# Patient Record
Sex: Male | Born: 1935 | Race: White | Hispanic: No | Marital: Married | Smoking: Former smoker
Health system: Southern US, Community
[De-identification: ages and names within clinical notes are randomized; demographics above are authoritative.]

## PROBLEM LIST (undated history)

## (undated) DIAGNOSIS — M159 Polyosteoarthritis, unspecified: Secondary | ICD-10-CM

## (undated) DIAGNOSIS — N200 Calculus of kidney: Secondary | ICD-10-CM

## (undated) DIAGNOSIS — E785 Hyperlipidemia, unspecified: Secondary | ICD-10-CM

## (undated) DIAGNOSIS — N183 Chronic kidney disease, stage 3 (moderate): Secondary | ICD-10-CM

## (undated) DIAGNOSIS — H25019 Cortical age-related cataract, unspecified eye: Secondary | ICD-10-CM

## (undated) DIAGNOSIS — I10 Essential (primary) hypertension: Secondary | ICD-10-CM

## (undated) DIAGNOSIS — N400 Enlarged prostate without lower urinary tract symptoms: Secondary | ICD-10-CM

## (undated) DIAGNOSIS — H409 Unspecified glaucoma: Secondary | ICD-10-CM

## (undated) DIAGNOSIS — E0590 Thyrotoxicosis, unspecified without thyrotoxic crisis or storm: Secondary | ICD-10-CM

## (undated) DIAGNOSIS — M1990 Unspecified osteoarthritis, unspecified site: Secondary | ICD-10-CM

## (undated) DIAGNOSIS — J45909 Unspecified asthma, uncomplicated: Secondary | ICD-10-CM

## (undated) DIAGNOSIS — M359 Systemic involvement of connective tissue, unspecified: Secondary | ICD-10-CM

## (undated) HISTORY — DX: Thyrotoxicosis, unspecified without thyrotoxic crisis or storm: E05.90

## (undated) HISTORY — DX: Calculus of kidney: N20.0

## (undated) HISTORY — DX: Unspecified osteoarthritis, unspecified site: M19.90

## (undated) HISTORY — PX: JOINT REPLACEMENT: SHX530

## (undated) HISTORY — DX: Essential (primary) hypertension: I10

## (undated) HISTORY — DX: Cortical age-related cataract, unspecified eye: H25.019

## (undated) HISTORY — DX: Unspecified glaucoma: H40.9

## (undated) HISTORY — DX: Polyosteoarthritis, unspecified: M15.9

## (undated) HISTORY — PX: CATARACT EXTRACTION: SUR2

## (undated) HISTORY — PX: OTHER SURGICAL HISTORY: SHX169

## (undated) HISTORY — DX: Hyperlipidemia, unspecified: E78.5

## (undated) HISTORY — PX: TOTAL HIP ARTHROPLASTY: SHX124

## (undated) HISTORY — DX: Benign prostatic hyperplasia without lower urinary tract symptoms: N40.0

## (undated) HISTORY — DX: Chronic kidney disease, stage 3 (moderate): N18.3

## (undated) HISTORY — PX: LITHOTRIPSY: SUR834

---

## 2004-12-27 ENCOUNTER — Ambulatory Visit: Payer: Self-pay | Admitting: Ophthalmology

## 2004-12-31 ENCOUNTER — Ambulatory Visit: Payer: Self-pay | Admitting: Ophthalmology

## 2005-08-22 ENCOUNTER — Ambulatory Visit: Payer: Self-pay | Admitting: Internal Medicine

## 2005-08-26 ENCOUNTER — Ambulatory Visit: Payer: Self-pay | Admitting: *Deleted

## 2005-08-26 ENCOUNTER — Other Ambulatory Visit: Payer: Self-pay

## 2006-02-21 ENCOUNTER — Ambulatory Visit: Payer: Self-pay | Admitting: Unknown Physician Specialty

## 2006-02-21 HISTORY — PX: ESOPHAGOGASTRODUODENOSCOPY: SHX1529

## 2006-02-21 HISTORY — PX: COLONOSCOPY: SHX174

## 2006-03-10 ENCOUNTER — Ambulatory Visit: Payer: Self-pay | Admitting: Internal Medicine

## 2006-03-13 ENCOUNTER — Ambulatory Visit: Payer: Self-pay | Admitting: Internal Medicine

## 2006-03-26 ENCOUNTER — Ambulatory Visit: Payer: Self-pay | Admitting: Internal Medicine

## 2008-05-02 ENCOUNTER — Ambulatory Visit: Payer: Self-pay

## 2009-03-31 ENCOUNTER — Ambulatory Visit: Payer: Self-pay | Admitting: Urology

## 2014-05-03 DIAGNOSIS — N200 Calculus of kidney: Secondary | ICD-10-CM

## 2014-05-03 DIAGNOSIS — M159 Polyosteoarthritis, unspecified: Secondary | ICD-10-CM

## 2014-05-03 DIAGNOSIS — N400 Enlarged prostate without lower urinary tract symptoms: Secondary | ICD-10-CM

## 2014-05-03 DIAGNOSIS — E785 Hyperlipidemia, unspecified: Secondary | ICD-10-CM

## 2014-05-03 DIAGNOSIS — I10 Essential (primary) hypertension: Secondary | ICD-10-CM

## 2014-05-03 DIAGNOSIS — N183 Chronic kidney disease, stage 3 unspecified: Secondary | ICD-10-CM

## 2014-05-03 HISTORY — DX: Calculus of kidney: N20.0

## 2014-05-03 HISTORY — DX: Hyperlipidemia, unspecified: E78.5

## 2014-05-03 HISTORY — DX: Polyosteoarthritis, unspecified: M15.9

## 2014-05-03 HISTORY — DX: Essential (primary) hypertension: I10

## 2014-05-03 HISTORY — DX: Chronic kidney disease, stage 3 unspecified: N18.30

## 2014-05-03 HISTORY — DX: Benign prostatic hyperplasia without lower urinary tract symptoms: N40.0

## 2016-08-02 ENCOUNTER — Ambulatory Visit: Payer: Self-pay

## 2016-08-16 ENCOUNTER — Encounter: Payer: Self-pay | Admitting: Urology

## 2016-08-16 ENCOUNTER — Ambulatory Visit (INDEPENDENT_AMBULATORY_CARE_PROVIDER_SITE_OTHER): Payer: Medicare Other | Admitting: Urology

## 2016-08-16 VITALS — BP 166/72 | HR 69 | Ht 69.0 in | Wt 157.8 lb

## 2016-08-16 DIAGNOSIS — N200 Calculus of kidney: Secondary | ICD-10-CM | POA: Diagnosis not present

## 2016-08-16 DIAGNOSIS — R972 Elevated prostate specific antigen [PSA]: Secondary | ICD-10-CM | POA: Diagnosis not present

## 2016-08-16 DIAGNOSIS — N400 Enlarged prostate without lower urinary tract symptoms: Secondary | ICD-10-CM | POA: Diagnosis not present

## 2016-08-16 LAB — BLADDER SCAN AMB NON-IMAGING: SCAN RESULT: 32

## 2016-08-16 NOTE — Progress Notes (Signed)
08/16/2016 11:43 AM   Cristopher Estimable Lun 1925-01-28 [id number]  Referring provider: Rusty Aus, MD [facility] [place], [place] [address]  Chief Complaint  Patient presents with  . New Patient (Initial Visit)    elevated PSA    HPI: The patient is an 82 year old gentleman who presents for evaluation for an elevated PSA.    1. Elevated PSA His most recent PSA is 7.19. He is never had a prostate biopsy. He has never seen urologist for this before.  8/17   7.19 6/17   5.60 6/15   3.75  2. BPH The patient has nocturia 2-3 however this is mainly him prone to the pressure when he helps his wife to use the bathroom. He has some hesitancy and weak stream. His PVR is 32. He is not on medications for this. He does not find the symptoms bothersome.  He did have a TURP decades ago.  3. History of nephrolithiasis The patient had lithotripsy decades ago. No signs or symptoms of stones recently.  PMH: Past Medical History:  Diagnosis Date  . Arthritis   . Glaucoma   . Hyperlipidemia   . Hypertension   . Hyperthyroidism     Surgical History: Past Surgical History:  Procedure Laterality Date  . kidney stone    . TOTAL HIP ARTHROPLASTY    . TUBAL LIGATION      Home Medications:    Medication List       Accurate as of 08/16/16 11:43 AM. Always use your most recent med list.          aspirin EC 81 MG tablet Take by mouth.   BENICAR 40 MG tablet Generic drug:  olmesartan TAKE 1 TABLET BY MOUTH ONCE DAILY.   brimonidine 0.1 % Soln Commonly known as:  ALPHAGAN P Apply to eye.   hydroxychloroquine 200 MG tablet Commonly known as:  PLAQUENIL TAKE 1 TABLET BY MOUTH ONCE DAILY.   LUMIGAN 0.01 % Soln Generic drug:  bimatoprost Apply to eye.   metoprolol succinate 50 MG 24 hr tablet Commonly known as:  TOPROL-XL TAKE ONE TABLET BY MOUTH ONCE DAILY   montelukast 10 MG tablet Commonly known as:  SINGULAIR Take by  mouth.   niacin 500 MG tablet Take by mouth.   simvastatin 40 MG tablet Commonly known as:  ZOCOR TAKE 1 TABLET BY MOUTH EVERY NIGHT.   SYMBICORT 80-4.5 MCG/ACT inhaler Generic drug:  budesonide-formoterol INHALE 2 PUFFS BY MOUTH TWICE DAILY AS DIRECTED   SYNTHROID 50 MCG tablet Generic drug:  levothyroxine TAKE 1 TABLET BY MOUTH EVERY MORNING ON AN EMPTY STOMACH. AT LEAST 30-60 MINUTESBEFORE BREAKFAST, WITH A GLASS OF WATER   triamterene-hydrochlorothiazide 75-50 MG tablet Commonly known as:  MAXZIDE TAKE 1 TABLET BY MOUTH ONCE DAILY.   Vitamin D3 2000 units capsule Take by mouth.       Allergies: Allergies not on file  Family History: Family History  Problem Relation Age of Onset  . Prostate cancer Neg Hx     Social History:  reports that he quit smoking about 12 years ago. He has never used smokeless tobacco. He reports that he does not drink alcohol or use drugs.  ROS: UROLOGY Frequent Urination?: Yes Hard to postpone urination?: Yes Burning/pain with urination?: No Get up at night to urinate?: Yes Leakage of urine?: Yes Urine stream starts and stops?: Yes Trouble starting stream?: Yes Do you have to strain to urinate?: Yes Blood in urine?: No  Urinary tract infection?: No Sexually transmitted disease?: No Injury to kidneys or bladder?: No Painful intercourse?: No Weak stream?: Yes Erection problems?: Yes Penile pain?: No  Gastrointestinal Nausea?: No Vomiting?: No Indigestion/heartburn?: No Diarrhea?: No Constipation?: No  Constitutional Fever: No Night sweats?: No Weight loss?: No Fatigue?: No  Skin Skin rash/lesions?: No Itching?: No  Eyes Blurred vision?: No Double vision?: No  Ears/Nose/Throat Sore throat?: No Sinus problems?: No  Hematologic/Lymphatic Swollen glands?: No Easy bruising?: Yes  Cardiovascular Leg swelling?: Yes Chest pain?: No  Respiratory Cough?: No Shortness of breath?: No  Endocrine Excessive  thirst?: No  Musculoskeletal Back pain?: Yes Joint pain?: Yes  Neurological Headaches?: No Dizziness?: No  Psychologic Depression?: No Anxiety?: No  Physical Exam: BP (!) 166/72   Pulse 69   Ht 5\' 9"  (1.753 m)   Wt 157 lb 12.8 oz (71.6 kg)   BMI 23.30 kg/m   Constitutional:  Alert and oriented, No acute distress. HEENT: [place] AT, moist mucus membranes.  Trachea midline, no masses. Cardiovascular: No clubbing, cyanosis, or edema. Respiratory: Normal respiratory effort, no increased work of breathing. GI: Abdomen is soft, nontender, nondistended, no abdominal masses GU: No CVA tenderness. DRE: 3+ smooth no nodules. Benign. Skin: No rashes, bruises or suspicious lesions. Lymph: No cervical or inguinal adenopathy. Neurologic: Grossly intact, no focal deficits, moving all 4 extremities. Psychiatric: Normal mood and affect.  Laboratory Data: No results found for: WBC, HGB, HCT, MCV, PLT  No results found for: CREATININE  No results found for: PSA  No results found for: TESTOSTERONE  No results found for: HGBA1C  Urinalysis No results found for: COLORURINE, APPEARANCEUR, LABSPEC, PHURINE, GLUCOSEU, HGBUR, BILIRUBINUR, KETONESUR, PROTEINUR, UROBILINOGEN, NITRITE, LEUKOCYTESUR   Assessment & Plan:    1. Elevated PSA I had an extended conversation with the patient and his wife regarding the role for PSA testing to identify prostate cancer. We discussed in great depth the American Urological Association's guidelines for only screening men between the age of 90 and 23 with a least a 78 year life expectancy.  We discussed the role for PSA testing as to identify young man with an extended life expectancy the would benefit from treatment for prostate cancer. We discussed the natural history of prostate cancer in that prostate cancer does not tend to metastasize for 8-10 years from diagnosis and does not cause death.for another 5 years. We also discussed autopsy studies on men who died  of something other than prostate cancer that showed 90% of the men over the age of 68 had prostate cancer, but it is something that they die with and not from. We discussed the typical ways to monitor an elevated PSA which include watchful waiting by checking PSA intermittently or prostate biopsy. Since this patient is well outside the guidelines recommend for PSA screening, I highly recommend against prostate biopsy. I don't even think it would be unreasonable to stop checking PSA altogether even though his PSA is slightly elevated. To be in the safe side, we will check a PSA in 1 year, but this probably unecessary. It is very, very unlikely this patient will ever need treatment for prostate cancer given his age.  Return in about 1 year (around 08/16/2017) for PSA prior.  Nickie Retort, MD  [facility] [address] [place], [place] [address] [phone]

## 2016-08-29 ENCOUNTER — Other Ambulatory Visit: Payer: Self-pay | Admitting: Rheumatology

## 2016-08-29 DIAGNOSIS — M545 Low back pain, unspecified: Secondary | ICD-10-CM

## 2016-08-29 DIAGNOSIS — G8929 Other chronic pain: Secondary | ICD-10-CM

## 2016-09-06 ENCOUNTER — Ambulatory Visit: Payer: Medicare Other

## 2017-02-27 ENCOUNTER — Other Ambulatory Visit: Payer: Self-pay | Admitting: Rheumatology

## 2017-02-27 DIAGNOSIS — M545 Low back pain: Principal | ICD-10-CM

## 2017-02-27 DIAGNOSIS — G8929 Other chronic pain: Secondary | ICD-10-CM

## 2017-03-07 ENCOUNTER — Ambulatory Visit
Admission: RE | Admit: 2017-03-07 | Discharge: 2017-03-07 | Disposition: A | Discharge status: Home / Self Care | Payer: Medicare Other | Source: Ambulatory Visit | Attending: Rheumatology | Admitting: Rheumatology

## 2017-03-07 DIAGNOSIS — M48061 Spinal stenosis, lumbar region without neurogenic claudication: Secondary | ICD-10-CM | POA: Insufficient documentation

## 2017-03-07 DIAGNOSIS — M545 Low back pain, unspecified: Secondary | ICD-10-CM

## 2017-03-07 DIAGNOSIS — G8929 Other chronic pain: Secondary | ICD-10-CM | POA: Diagnosis present

## 2017-03-07 DIAGNOSIS — M1990 Unspecified osteoarthritis, unspecified site: Secondary | ICD-10-CM | POA: Insufficient documentation

## 2017-03-18 ENCOUNTER — Other Ambulatory Visit: Payer: Self-pay | Admitting: Rheumatology

## 2017-03-18 DIAGNOSIS — N2889 Other specified disorders of kidney and ureter: Secondary | ICD-10-CM

## 2017-03-24 ENCOUNTER — Ambulatory Visit
Admission: RE | Admit: 2017-03-24 | Discharge: 2017-03-24 | Disposition: A | Discharge status: Home / Self Care | Payer: Medicare Other | Source: Ambulatory Visit | Attending: Rheumatology | Admitting: Rheumatology

## 2017-03-24 DIAGNOSIS — N2889 Other specified disorders of kidney and ureter: Secondary | ICD-10-CM | POA: Insufficient documentation

## 2017-03-24 DIAGNOSIS — R911 Solitary pulmonary nodule: Secondary | ICD-10-CM | POA: Diagnosis not present

## 2017-03-24 DIAGNOSIS — K8020 Calculus of gallbladder without cholecystitis without obstruction: Secondary | ICD-10-CM | POA: Diagnosis not present

## 2017-03-24 DIAGNOSIS — I700 Atherosclerosis of aorta: Secondary | ICD-10-CM | POA: Diagnosis not present

## 2017-03-24 DIAGNOSIS — K4090 Unilateral inguinal hernia, without obstruction or gangrene, not specified as recurrent: Secondary | ICD-10-CM | POA: Insufficient documentation

## 2017-04-09 ENCOUNTER — Ambulatory Visit: Payer: Medicare Other

## 2017-04-16 ENCOUNTER — Ambulatory Visit (INDEPENDENT_AMBULATORY_CARE_PROVIDER_SITE_OTHER): Payer: Medicare Other | Admitting: Urology

## 2017-04-16 ENCOUNTER — Encounter: Payer: Self-pay | Admitting: Urology

## 2017-04-16 VITALS — BP 185/78 | HR 76 | Ht 69.0 in | Wt 157.6 lb

## 2017-04-16 DIAGNOSIS — R972 Elevated prostate specific antigen [PSA]: Secondary | ICD-10-CM | POA: Diagnosis not present

## 2017-04-16 DIAGNOSIS — D4102 Neoplasm of uncertain behavior of left kidney: Secondary | ICD-10-CM

## 2017-04-16 DIAGNOSIS — D4122 Neoplasm of uncertain behavior of left ureter: Secondary | ICD-10-CM | POA: Diagnosis not present

## 2017-04-16 NOTE — Progress Notes (Signed)
04/16/2017 12:12 PM   Logan Conner August 25, 1925 [id number]  Referring provider: Rusty Aus, MD [facility] [place], [place] [address]  Chief Complaint  Patient presents with  . Follow-up    HPI:  83 year old referred for left renal mass. Patient underwent a noncontrast MR of the lumbar spine 03/07/2017. This revealed a solid mass left medial superior kidney on the posterior aspect just behind the hilar vessels. A follow-up noncontrast CT scan revealed similar findings 03/24/2017. The patient was scanned for low back pain and leg pain. He has had no flank pain or gross hematuria. His December 2017 BUN was 30, creatinine 1.1, GFR 63. GFR has been lower at 48-57.  He has a history of BPH and elevated PSA. He has never had a prostate biopsy. He has chosen a conservative approach with a PSA due October 2018 to rule out advanced prostate cancer. A May 2018 noncontrast CT showed no lymphadenopathy or obvious bony metastases. PSA hx: 8/17   7.19 6/17   5.60 6/15   3.75  Prostate measured about 42 grams on May 2018 CT. The patient has nocturia 2-3 however this is mainly him prone to the pressure when he helps his wife to use the bathroom. He has some hesitancy and weak stream. His PVR was 32. He is not on medications for this. He does not find the symptoms bothersome. He did have a TURP decades ago.   PMH: Past Medical History:  Diagnosis Date  . Arthritis   . Glaucoma   . Hyperlipidemia   . Hypertension   . Hyperthyroidism     Surgical History: Past Surgical History:  Procedure Laterality Date  . kidney stone    . TOTAL HIP ARTHROPLASTY    . TUBAL LIGATION      Home Medications:  Allergies as of 04/16/2017   Not on File     Medication List       Accurate as of 04/16/17 12:12 PM. Always use your most recent med list.          aspirin EC 81 MG tablet Take by mouth.   BENICAR 40 MG tablet Generic drug:   olmesartan TAKE 1 TABLET BY MOUTH ONCE DAILY.   brimonidine 0.1 % Soln Commonly known as:  ALPHAGAN P Apply to eye.   diclofenac sodium 1 % Gel Commonly known as:  VOLTAREN Apply 2 g topically 2 (two) times daily.   hydroxychloroquine 200 MG tablet Commonly known as:  PLAQUENIL TAKE 1 TABLET BY MOUTH ONCE DAILY.   LUMIGAN 0.01 % Soln Generic drug:  bimatoprost Apply to eye.   metoprolol succinate 50 MG 24 hr tablet Commonly known as:  TOPROL-XL TAKE ONE TABLET BY MOUTH ONCE DAILY   montelukast 10 MG tablet Commonly known as:  SINGULAIR Take by mouth.   niacin 500 MG tablet Take by mouth.   omeprazole 10 MG capsule Commonly known as:  PRILOSEC Take 10 mg by mouth daily as needed.   simvastatin 40 MG tablet Commonly known as:  ZOCOR TAKE 1 TABLET BY MOUTH EVERY NIGHT.   SYMBICORT 80-4.5 MCG/ACT inhaler Generic drug:  budesonide-formoterol INHALE 2 PUFFS BY MOUTH TWICE DAILY AS DIRECTED   SYNTHROID 50 MCG tablet Generic drug:  levothyroxine TAKE 1 TABLET BY MOUTH EVERY MORNING ON AN EMPTY STOMACH. AT LEAST 30-60 MINUTESBEFORE BREAKFAST, WITH A GLASS OF WATER   triamterene-hydrochlorothiazide 75-50 MG tablet Commonly known as:  MAXZIDE TAKE 1 TABLET BY MOUTH ONCE DAILY.  Vitamin D3 2000 units capsule Take by mouth.       Allergies: Not on File  Family History: Family History  Problem Relation Age of Onset  . Prostate cancer Neg Hx     Social History:  reports that he quit smoking about 12 years ago. He has never used smokeless tobacco. He reports that he does not drink alcohol or use drugs.  ROS: UROLOGY Frequent Urination?: Yes Hard to postpone urination?: Yes Burning/pain with urination?: No Get up at night to urinate?: Yes Leakage of urine?: No Urine stream starts and stops?: Yes Trouble starting stream?: No Do you have to strain to urinate?: No Blood in urine?: No Urinary tract infection?: No Sexually transmitted disease?: No Injury to  kidneys or bladder?: No Painful intercourse?: No Weak stream?: No Erection problems?: No Penile pain?: No  Gastrointestinal Nausea?: No Vomiting?: No Indigestion/heartburn?: No Diarrhea?: No Constipation?: Yes  Constitutional Fever: No Night sweats?: No Weight loss?: No Fatigue?: No  Skin Skin rash/lesions?: No Itching?: No  Eyes Blurred vision?: No Double vision?: No  Ears/Nose/Throat Sore throat?: No Sinus problems?: No  Hematologic/Lymphatic Swollen glands?: No Easy bruising?: No  Cardiovascular Leg swelling?: No Chest pain?: No  Respiratory Cough?: No Shortness of breath?: No  Endocrine Excessive thirst?: No  Musculoskeletal Back pain?: No Joint pain?: No  Neurological Headaches?: No Dizziness?: No  Psychologic Depression?: No Anxiety?: No  Physical Exam: BP (!) 185/78 (BP Location: Left Arm, Patient Position: Sitting, Cuff Size: Large)   Pulse 76   Ht 5\' 9"  (1.753 m)   Wt 71.5 kg (157 lb 9.6 oz)   BMI 23.27 kg/m   Constitutional:  Alert and oriented, No acute distress. HEENT: [place] AT, moist mucus membranes.  Trachea midline, no masses. Cardiovascular: No clubbing, cyanosis, or edema. Respiratory: Normal respiratory effort, no increased work of breathing. GI: Abdomen is soft, nontender, nondistended, no abdominal masses Skin: No rashes, bruises or suspicious lesions. Neurologic: Grossly intact, no focal deficits, moving all 4 extremities. Psychiatric: Normal mood and affect.  Laboratory Data: No results found for: WBC, HGB, HCT, MCV, PLT  No results found for: CREATININE  No results found for: PSA  No results found for: TESTOSTERONE  No results found for: HGBA1C  Urinalysis No results found for: COLORURINE, APPEARANCEUR, LABSPEC, PHURINE, GLUCOSEU, HGBUR, BILIRUBINUR, KETONESUR, PROTEINUR, UROBILINOGEN, NITRITE, LEUKOCYTESUR  Pertinent Imaging: MRI and CT   Assessment & Plan:    1) left renal mass  - I discussed with the  patient and his wife the nature of renal masses-benign versus malignant. Solid renal masses are more likely to be malignant, however they typically slowly grow and rarely metastasize under surveillance. At his age of surveillance protocol as attractive as evidenced by the Way[employer] Denis kidney cancer prediction tool indicating a 4.9% risk of kidney cancer mortality compared to other causes, if this is a malignant tumor. In that regard we discussed the role of renal mass biopsy as well as treatment such as ablation, partial nephrectomy or radical nephrectomy. Unfortunately this tumor is posterior and very medial and right behind some of the major hilar vessels. It would be difficult to ablate or do a partial. We discussed a brief period of surveillance and will recheck a CT scan in 3 months.  2) elevated PSA - as Dr. Pilar Jarvis had discussed with the patient, we discussed the nature of elevated PSA and the prostate cancer is not uncommon in the later decades. Similar to kidney cancer the risks of treatment often outweigh the  benefits, but we will continue to monitor for locally advanced or metastatic prostate cancer. Unfortunately his recent CT scan showed no obvious prostate cancer metastatic disease. We'll check a PSA in about 3 months.  Follow-up in 3 months to review PSA and CT scan.  There are no diagnoses linked to this encounter.  No Follow-up on file.  Festus Aloe, [facility] [address] [place], [place] [address] [phone]

## 2017-06-02 ENCOUNTER — Encounter
Admission: RE | Admit: 2017-06-02 | Discharge: 2017-06-02 | Disposition: A | Discharge status: Home / Self Care | Payer: Medicare Other | Source: Ambulatory Visit | Attending: Neurosurgery | Admitting: Neurosurgery

## 2017-06-02 DIAGNOSIS — Z87442 Personal history of urinary calculi: Secondary | ICD-10-CM | POA: Diagnosis not present

## 2017-06-02 DIAGNOSIS — Z87891 Personal history of nicotine dependence: Secondary | ICD-10-CM | POA: Diagnosis not present

## 2017-06-02 DIAGNOSIS — J449 Chronic obstructive pulmonary disease, unspecified: Secondary | ICD-10-CM | POA: Diagnosis not present

## 2017-06-02 DIAGNOSIS — Z7982 Long term (current) use of aspirin: Secondary | ICD-10-CM | POA: Diagnosis not present

## 2017-06-02 DIAGNOSIS — E785 Hyperlipidemia, unspecified: Secondary | ICD-10-CM | POA: Diagnosis not present

## 2017-06-02 DIAGNOSIS — N400 Enlarged prostate without lower urinary tract symptoms: Secondary | ICD-10-CM | POA: Diagnosis not present

## 2017-06-02 DIAGNOSIS — M48062 Spinal stenosis, lumbar region with neurogenic claudication: Secondary | ICD-10-CM | POA: Diagnosis present

## 2017-06-02 DIAGNOSIS — N183 Chronic kidney disease, stage 3 (moderate): Secondary | ICD-10-CM | POA: Diagnosis not present

## 2017-06-02 DIAGNOSIS — I129 Hypertensive chronic kidney disease with stage 1 through stage 4 chronic kidney disease, or unspecified chronic kidney disease: Secondary | ICD-10-CM | POA: Diagnosis not present

## 2017-06-02 DIAGNOSIS — Z9849 Cataract extraction status, unspecified eye: Secondary | ICD-10-CM | POA: Diagnosis not present

## 2017-06-02 DIAGNOSIS — Z8249 Family history of ischemic heart disease and other diseases of the circulatory system: Secondary | ICD-10-CM | POA: Diagnosis not present

## 2017-06-02 DIAGNOSIS — M159 Polyosteoarthritis, unspecified: Secondary | ICD-10-CM | POA: Diagnosis not present

## 2017-06-02 DIAGNOSIS — Z809 Family history of malignant neoplasm, unspecified: Secondary | ICD-10-CM | POA: Diagnosis not present

## 2017-06-02 DIAGNOSIS — Z96641 Presence of right artificial hip joint: Secondary | ICD-10-CM | POA: Diagnosis not present

## 2017-06-02 DIAGNOSIS — E0590 Thyrotoxicosis, unspecified without thyrotoxic crisis or storm: Secondary | ICD-10-CM | POA: Diagnosis not present

## 2017-06-02 DIAGNOSIS — Z79899 Other long term (current) drug therapy: Secondary | ICD-10-CM | POA: Diagnosis not present

## 2017-06-02 LAB — PROTIME-INR
INR: 1.07
PROTHROMBIN TIME: 13.9 s (ref 11.4–15.2)

## 2017-06-02 LAB — APTT: aPTT: 34 seconds (ref 24–36)

## 2017-06-02 LAB — SURGICAL PCR SCREEN
MRSA, PCR: POSITIVE — AB
STAPHYLOCOCCUS AUREUS: POSITIVE — AB

## 2017-06-02 MED ORDER — CEFAZOLIN SODIUM-DEXTROSE 2-4 GM/100ML-% IV SOLN
2.0000 g | INTRAVENOUS | Status: DC
  Filled 2017-06-02: qty 100

## 2017-06-02 NOTE — Pre-Procedure Instructions (Signed)
Patty notified of positive PCR screen

## 2017-06-02 NOTE — Pre-Procedure Instructions (Signed)
PCR screen results faxed to Dr Rhea Bleacher office- MRSA positive and staph positive

## 2017-06-02 NOTE — Progress Notes (Signed)
Pharmacy consulted to dose pre-op weight based cefazolin dose:  Ordered cefazolin 2 g IV to be given prior to procedure on 06/04/17  Lenis Noon, PharmD 06/02/17 2:06 PM

## 2017-06-02 NOTE — Patient Instructions (Signed)
Your procedure is scheduled FG:[id number] 25, 2018 [facility] Report to Same Day Surgery on the 2nd floor in the Short. To find out your arrival time, please call [phone] between 1PM - 3PM [id number] 24, 2018  REMEMBER: Instructions that are not followed completely may result in serious medical risk up to and including death; or upon the discretion of your surgeon and anesthesiologist your surgery may need to be rescheduled.  Do not eat food or drink liquids after midnight. No gum chewing or hard candies  No Alcohol for 24 hours before or after surgery.  No Smoking for 24 hours prior to surgery.  Notify your doctor if there is any change in your medical condition (cold, fever, infection).  Do not wear jewelry, make-up, hairpins, clips or nail polish.  Do not wear lotions, powders, or perfumes.   Do not shave 48 hours prior to surgery. Men may shave face and neck.  Contacts and dentures may not be worn into surgery.  Do not bring valuables to the hospital. [facility] is not responsible for any belongings or valuables.   TAKE THESE MEDICATIONS THE MORNING OF SURGERY WITH A SIP OF WATER: SYNTHROID METOPROLOL BENICAR OMEPRAZOLE - TAKE DOSE NIGHT BEFORE AND MORNING OF SURGERY PLAQUENIL    Use CHG Soap or wipes as directed on instruction sheet.  Use inhalers on the day of surgery and bring to the hospital.  Follow recommendations from Cardiologist, Pulmonologist or PCP regarding stopping Aspirin, - STOP ASPIRIN UNTIL AFTER SURGERY  Stop Anti-inflammatories such as Advil, Aleve, Ibuprofen, Motrin, Naproxen, Naprosyn, Goodie powder, or aspirin products. (May take Tylenol or Acetaminophen and Celebrex if needed.)  Stop supplements until after surgery. (May continue Vitamin D, Vitamin B, and multivitamin.)  If you are being admitted to the hospital overnight, leave your suitcase in the car. After surgery it may be brought to your room.  If you are being discharged the day of  surgery, you will not be allowed to drive home. You will need someone to drive you home and stay with you that night.   If you are taking public transportation, you will need to have a responsible adult to with you.

## 2017-06-03 DIAGNOSIS — M48062 Spinal stenosis, lumbar region with neurogenic claudication: Secondary | ICD-10-CM | POA: Diagnosis not present

## 2017-06-03 NOTE — Progress Notes (Signed)
Pharmacy consulted to dose Pre-op weight based Vancomycin for surgery on 7/25.  Ordered Vancomycin 1gm Once to be released by RN on 06/04/17  Prudy Feeler, [place] 06/03/17

## 2017-06-04 ENCOUNTER — Ambulatory Visit: Payer: Medicare Other

## 2017-06-04 ENCOUNTER — Ambulatory Visit
Admission: RE | Disposition: A | Discharge status: Home / Self Care | Payer: Self-pay | Source: Ambulatory Visit | Attending: Neurosurgery

## 2017-06-04 ENCOUNTER — Observation Stay
Admission: RE | Admit: 2017-06-04 | Discharge: 2017-06-05 | Disposition: A | Discharge status: Home / Self Care | Payer: Medicare Other | Source: Ambulatory Visit | Attending: Neurosurgery | Admitting: Neurosurgery

## 2017-06-04 ENCOUNTER — Encounter: Payer: Self-pay | Admitting: *Deleted

## 2017-06-04 ENCOUNTER — Ambulatory Visit: Payer: Medicare Other | Admitting: Anesthesiology

## 2017-06-04 DIAGNOSIS — Z809 Family history of malignant neoplasm, unspecified: Secondary | ICD-10-CM | POA: Insufficient documentation

## 2017-06-04 DIAGNOSIS — I129 Hypertensive chronic kidney disease with stage 1 through stage 4 chronic kidney disease, or unspecified chronic kidney disease: Secondary | ICD-10-CM | POA: Insufficient documentation

## 2017-06-04 DIAGNOSIS — Z8249 Family history of ischemic heart disease and other diseases of the circulatory system: Secondary | ICD-10-CM | POA: Insufficient documentation

## 2017-06-04 DIAGNOSIS — E785 Hyperlipidemia, unspecified: Secondary | ICD-10-CM | POA: Insufficient documentation

## 2017-06-04 DIAGNOSIS — Z419 Encounter for procedure for purposes other than remedying health state, unspecified: Secondary | ICD-10-CM

## 2017-06-04 DIAGNOSIS — E0590 Thyrotoxicosis, unspecified without thyrotoxic crisis or storm: Secondary | ICD-10-CM | POA: Insufficient documentation

## 2017-06-04 DIAGNOSIS — N183 Chronic kidney disease, stage 3 (moderate): Secondary | ICD-10-CM | POA: Insufficient documentation

## 2017-06-04 DIAGNOSIS — Z96641 Presence of right artificial hip joint: Secondary | ICD-10-CM | POA: Insufficient documentation

## 2017-06-04 DIAGNOSIS — J449 Chronic obstructive pulmonary disease, unspecified: Secondary | ICD-10-CM | POA: Insufficient documentation

## 2017-06-04 DIAGNOSIS — Z87891 Personal history of nicotine dependence: Secondary | ICD-10-CM | POA: Insufficient documentation

## 2017-06-04 DIAGNOSIS — Z87442 Personal history of urinary calculi: Secondary | ICD-10-CM | POA: Insufficient documentation

## 2017-06-04 DIAGNOSIS — M48062 Spinal stenosis, lumbar region with neurogenic claudication: Principal | ICD-10-CM | POA: Diagnosis present

## 2017-06-04 DIAGNOSIS — Z79899 Other long term (current) drug therapy: Secondary | ICD-10-CM | POA: Insufficient documentation

## 2017-06-04 DIAGNOSIS — N400 Enlarged prostate without lower urinary tract symptoms: Secondary | ICD-10-CM | POA: Insufficient documentation

## 2017-06-04 DIAGNOSIS — Z9849 Cataract extraction status, unspecified eye: Secondary | ICD-10-CM | POA: Insufficient documentation

## 2017-06-04 DIAGNOSIS — Z7982 Long term (current) use of aspirin: Secondary | ICD-10-CM | POA: Insufficient documentation

## 2017-06-04 DIAGNOSIS — M159 Polyosteoarthritis, unspecified: Secondary | ICD-10-CM | POA: Insufficient documentation

## 2017-06-04 HISTORY — PX: LUMBAR LAMINECTOMY/DECOMPRESSION MICRODISCECTOMY: SHX5026

## 2017-06-04 LAB — TYPE AND SCREEN
ABO/RH(D): A NEG
ANTIBODY SCREEN: NEGATIVE

## 2017-06-04 SURGERY — LUMBAR LAMINECTOMY/DECOMPRESSION MICRODISCECTOMY 2 LEVELS
Anesthesia: General | Laterality: Right

## 2017-06-04 MED ORDER — OXYCODONE HCL 5 MG/5ML PO SOLN: 5.0000 mg | Freq: Once | ORAL | Status: DC | PRN

## 2017-06-04 MED ORDER — DEXAMETHASONE SODIUM PHOSPHATE 10 MG/ML IJ SOLN
INTRAMUSCULAR | Status: DC | PRN
  Administered 2017-06-04: 10 mg via INTRAVENOUS

## 2017-06-04 MED ORDER — LIDOCAINE HCL (PF) 2 % IJ SOLN
INTRAMUSCULAR | Status: AC
  Filled 2017-06-04: qty 2

## 2017-06-04 MED ORDER — CHLORHEXIDINE GLUCONATE CLOTH 2 % EX PADS
6.0000 | MEDICATED_PAD | Freq: Every day | CUTANEOUS | Status: DC
  Administered 2017-06-04: 6 via TOPICAL

## 2017-06-04 MED ORDER — ROCURONIUM BROMIDE 50 MG/5ML IV SOLN
INTRAVENOUS | Status: AC
  Filled 2017-06-04: qty 1

## 2017-06-04 MED ORDER — CELECOXIB 200 MG PO CAPS
200.0000 mg | ORAL_CAPSULE | Freq: Two times a day (BID) | ORAL | Status: DC
  Administered 2017-06-04 – 2017-06-05 (×3): 200 mg via ORAL
  Filled 2017-06-04 (×3): qty 1

## 2017-06-04 MED ORDER — SODIUM CHLORIDE 0.9 % IV SOLN
INTRAVENOUS | Status: DC | PRN
  Administered 2017-06-04: 25 ug/min via INTRAVENOUS

## 2017-06-04 MED ORDER — BACITRACIN 50000 UNITS IM SOLR
INTRAMUSCULAR | Status: AC
Start: 2017-06-04 — End: 2017-06-04
  Filled 2017-06-04: qty 1

## 2017-06-04 MED ORDER — FENTANYL CITRATE (PF) 100 MCG/2ML IJ SOLN
INTRAMUSCULAR | Status: DC | PRN
  Administered 2017-06-04: 50 ug via INTRAVENOUS
  Administered 2017-06-04 (×2): 25 ug via INTRAVENOUS

## 2017-06-04 MED ORDER — SUCCINYLCHOLINE CHLORIDE 20 MG/ML IJ SOLN
INTRAMUSCULAR | Status: DC | PRN
  Administered 2017-06-04: 100 mg via INTRAVENOUS

## 2017-06-04 MED ORDER — BUPIVACAINE HCL (PF) 0.5 % IJ SOLN
INTRAMUSCULAR | Status: AC
  Filled 2017-06-04: qty 30

## 2017-06-04 MED ORDER — ACETAMINOPHEN 325 MG PO TABS
1300.0000 mg | ORAL_TABLET | Freq: Three times a day (TID) | ORAL | Status: DC
  Administered 2017-06-04 – 2017-06-05 (×3): 1300 mg via ORAL
  Filled 2017-06-04 (×3): qty 4

## 2017-06-04 MED ORDER — BUPIVACAINE-EPINEPHRINE (PF) 0.5% -1:200000 IJ SOLN
INTRAMUSCULAR | Status: AC
  Filled 2017-06-04: qty 30

## 2017-06-04 MED ORDER — SEVOFLURANE IN SOLN
RESPIRATORY_TRACT | Status: AC
  Filled 2017-06-04: qty 250

## 2017-06-04 MED ORDER — ONDANSETRON HCL 4 MG/2ML IJ SOLN
INTRAMUSCULAR | Status: DC | PRN
  Administered 2017-06-04: 4 mg via INTRAVENOUS

## 2017-06-04 MED ORDER — HYDROXYCHLOROQUINE SULFATE 200 MG PO TABS
200.0000 mg | ORAL_TABLET | Freq: Every day | ORAL | Status: DC
  Administered 2017-06-04 – 2017-06-05 (×2): 200 mg via ORAL
  Filled 2017-06-04 (×2): qty 1

## 2017-06-04 MED ORDER — DEXAMETHASONE 4 MG PO TABS
4.0000 mg | ORAL_TABLET | Freq: Four times a day (QID) | ORAL | Status: AC
  Administered 2017-06-04 (×3): 4 mg via ORAL
  Filled 2017-06-04 (×3): qty 1

## 2017-06-04 MED ORDER — LEVOTHYROXINE SODIUM 50 MCG PO TABS
50.0000 ug | ORAL_TABLET | Freq: Every day | ORAL | Status: DC
  Administered 2017-06-05: 50 ug via ORAL
  Filled 2017-06-04: qty 1

## 2017-06-04 MED ORDER — LACTATED RINGERS IV SOLN
INTRAVENOUS | Status: DC
  Administered 2017-06-04 (×2): via INTRAVENOUS

## 2017-06-04 MED ORDER — REMIFENTANIL HCL 1 MG IV SOLR
INTRAVENOUS | Status: DC | PRN
  Administered 2017-06-04: .025 ug/kg/min via INTRAVENOUS

## 2017-06-04 MED ORDER — SODIUM CHLORIDE 0.9% FLUSH
3.0000 mL | Freq: Two times a day (BID) | INTRAVENOUS | Status: DC
  Administered 2017-06-05: 3 mL via INTRAVENOUS

## 2017-06-04 MED ORDER — DEXAMETHASONE SODIUM PHOSPHATE 10 MG/ML IJ SOLN
INTRAMUSCULAR | Status: AC
  Filled 2017-06-04: qty 1

## 2017-06-04 MED ORDER — ADULT MULTIVITAMIN W/MINERALS CH
1.0000 | ORAL_TABLET | Freq: Every day | ORAL | Status: DC
  Administered 2017-06-05: 1 via ORAL
  Filled 2017-06-04: qty 1

## 2017-06-04 MED ORDER — SODIUM CHLORIDE 0.9% FLUSH: 3.0000 mL | INTRAVENOUS | Status: DC | PRN

## 2017-06-04 MED ORDER — PHENYLEPHRINE HCL 10 MG/ML IJ SOLN
INTRAMUSCULAR | Status: AC
  Filled 2017-06-04: qty 1

## 2017-06-04 MED ORDER — PANTOPRAZOLE SODIUM 40 MG PO TBEC
40.0000 mg | DELAYED_RELEASE_TABLET | Freq: Every day | ORAL | Status: DC
  Administered 2017-06-04 – 2017-06-05 (×2): 40 mg via ORAL
  Filled 2017-06-04 (×2): qty 1

## 2017-06-04 MED ORDER — METOPROLOL SUCCINATE ER 50 MG PO TB24
50.0000 mg | ORAL_TABLET | Freq: Every day | ORAL | Status: DC
  Administered 2017-06-04 – 2017-06-05 (×2): 50 mg via ORAL
  Filled 2017-06-04 (×2): qty 1

## 2017-06-04 MED ORDER — ACETAMINOPHEN 10 MG/ML IV SOLN
INTRAVENOUS | Status: DC | PRN
  Administered 2017-06-04: 1000 mg via INTRAVENOUS

## 2017-06-04 MED ORDER — MOMETASONE FURO-FORMOTEROL FUM 200-5 MCG/ACT IN AERO
2.0000 | INHALATION_SPRAY | Freq: Two times a day (BID) | RESPIRATORY_TRACT | Status: DC
Start: 2017-06-04 — End: 2017-06-05
  Administered 2017-06-04 – 2017-06-05 (×3): 2 via RESPIRATORY_TRACT
  Filled 2017-06-04: qty 8.8

## 2017-06-04 MED ORDER — OXYCODONE HCL 5 MG PO TABS: 5.0000 mg | ORAL_TABLET | ORAL | Status: DC | PRN

## 2017-06-04 MED ORDER — SODIUM CHLORIDE 0.9 % IV SOLN
INTRAVENOUS | Status: DC
  Administered 2017-06-04 – 2017-06-05 (×2): via INTRAVENOUS

## 2017-06-04 MED ORDER — TRIAMTERENE-HCTZ 37.5-25 MG PO TABS
2.0000 | ORAL_TABLET | Freq: Every day | ORAL | Status: DC
  Administered 2017-06-04 – 2017-06-05 (×2): 2 via ORAL
  Filled 2017-06-04 (×2): qty 2

## 2017-06-04 MED ORDER — LIDOCAINE HCL (CARDIAC) 20 MG/ML IV SOLN
INTRAVENOUS | Status: DC | PRN
  Administered 2017-06-04: 80 mg via INTRAVENOUS

## 2017-06-04 MED ORDER — FENTANYL CITRATE (PF) 100 MCG/2ML IJ SOLN
INTRAMUSCULAR | Status: AC
  Filled 2017-06-04: qty 2

## 2017-06-04 MED ORDER — METHOCARBAMOL 1000 MG/10ML IJ SOLN
500.0000 mg | Freq: Four times a day (QID) | INTRAVENOUS | Status: DC | PRN
  Filled 2017-06-04: qty 5

## 2017-06-04 MED ORDER — BACITRACIN 50000 UNITS IM SOLR
INTRAMUSCULAR | Status: DC | PRN
  Administered 2017-06-04: 50000 [IU]

## 2017-06-04 MED ORDER — PROPOFOL 10 MG/ML IV BOLUS
INTRAVENOUS | Status: DC | PRN
  Administered 2017-06-04: 70 mg via INTRAVENOUS

## 2017-06-04 MED ORDER — MENTHOL 3 MG MT LOZG
1.0000 | LOZENGE | OROMUCOSAL | Status: DC | PRN
  Filled 2017-06-04: qty 9

## 2017-06-04 MED ORDER — BRIMONIDINE TARTRATE 0.15 % OP SOLN
1.0000 [drp] | Freq: Two times a day (BID) | OPHTHALMIC | Status: DC
Start: 2017-06-04 — End: 2017-06-05
  Administered 2017-06-04 – 2017-06-05 (×3): 1 [drp] via OPHTHALMIC
  Filled 2017-06-04: qty 5

## 2017-06-04 MED ORDER — HYDRALAZINE HCL 20 MG/ML IJ SOLN
5.0000 mg | Freq: Once | INTRAMUSCULAR | Status: AC
  Administered 2017-06-04: 5 mg via INTRAVENOUS

## 2017-06-04 MED ORDER — BUPIVACAINE HCL (PF) 0.5 % IJ SOLN
INTRAMUSCULAR | Status: DC | PRN
  Administered 2017-06-04: 20 mL

## 2017-06-04 MED ORDER — SODIUM CHLORIDE 0.9 % IJ SOLN
INTRAMUSCULAR | Status: AC
  Filled 2017-06-04: qty 10

## 2017-06-04 MED ORDER — GELATIN ABSORBABLE 12-7 MM EX MISC
CUTANEOUS | Status: AC
  Filled 2017-06-04: qty 1

## 2017-06-04 MED ORDER — FENTANYL CITRATE (PF) 100 MCG/2ML IJ SOLN
INTRAMUSCULAR | Status: AC
  Administered 2017-06-04: 25 ug via INTRAVENOUS
  Filled 2017-06-04: qty 2

## 2017-06-04 MED ORDER — SODIUM CHLORIDE 0.9 % IV SOLN
INTRAVENOUS | Status: DC | PRN
  Administered 2017-06-04: 40 mL

## 2017-06-04 MED ORDER — ONDANSETRON HCL 4 MG/2ML IJ SOLN
INTRAMUSCULAR | Status: AC
Start: 2017-06-04 — End: 2017-06-04
  Filled 2017-06-04: qty 2

## 2017-06-04 MED ORDER — METHOCARBAMOL 500 MG PO TABS
500.0000 mg | ORAL_TABLET | Freq: Four times a day (QID) | ORAL | Status: DC | PRN
  Administered 2017-06-04: 500 mg via ORAL
  Filled 2017-06-04: qty 1

## 2017-06-04 MED ORDER — LATANOPROST 0.005 % OP SOLN
1.0000 [drp] | Freq: Every day | OPHTHALMIC | Status: DC
Start: 2017-06-04 — End: 2017-06-05
  Administered 2017-06-04: 1 [drp] via OPHTHALMIC
  Filled 2017-06-04: qty 2.5

## 2017-06-04 MED ORDER — SODIUM CHLORIDE FLUSH 0.9 % IV SOLN
INTRAVENOUS | Status: AC
  Filled 2017-06-04: qty 20

## 2017-06-04 MED ORDER — SUGAMMADEX SODIUM 200 MG/2ML IV SOLN
INTRAVENOUS | Status: AC
  Filled 2017-06-04: qty 2

## 2017-06-04 MED ORDER — VANCOMYCIN HCL IN DEXTROSE 1-5 GM/200ML-% IV SOLN
1000.0000 mg | Freq: Once | INTRAVENOUS | Status: AC
Start: 2017-06-04 — End: 2017-06-04
  Administered 2017-06-04: 1000 mg via INTRAVENOUS

## 2017-06-04 MED ORDER — MONTELUKAST SODIUM 10 MG PO TABS
10.0000 mg | ORAL_TABLET | Freq: Every day | ORAL | Status: DC
  Administered 2017-06-04: 10 mg via ORAL
  Filled 2017-06-04: qty 1

## 2017-06-04 MED ORDER — IRBESARTAN 75 MG PO TABS
37.5000 mg | ORAL_TABLET | Freq: Every day | ORAL | Status: DC
  Administered 2017-06-04 – 2017-06-05 (×2): 37.5 mg via ORAL
  Filled 2017-06-04 (×2): qty 0.5

## 2017-06-04 MED ORDER — VITAMIN D 1000 UNITS PO TABS
1000.0000 [IU] | ORAL_TABLET | Freq: Every day | ORAL | Status: DC
  Administered 2017-06-05: 1000 [IU] via ORAL
  Filled 2017-06-04: qty 1

## 2017-06-04 MED ORDER — SIMVASTATIN 20 MG PO TABS
40.0000 mg | ORAL_TABLET | Freq: Every day | ORAL | Status: DC
  Administered 2017-06-04: 40 mg via ORAL
  Filled 2017-06-04: qty 2

## 2017-06-04 MED ORDER — REMIFENTANIL HCL 1 MG IV SOLR
INTRAVENOUS | Status: AC
  Filled 2017-06-04: qty 1000

## 2017-06-04 MED ORDER — MORPHINE SULFATE (PF) 2 MG/ML IV SOLN: 1.0000 mg | INTRAVENOUS | Status: DC | PRN

## 2017-06-04 MED ORDER — METHYLPREDNISOLONE ACETATE 40 MG/ML IJ SUSP
INTRAMUSCULAR | Status: AC
  Filled 2017-06-04: qty 1

## 2017-06-04 MED ORDER — BUPIVACAINE LIPOSOME 1.3 % IJ SUSP
INTRAMUSCULAR | Status: AC
  Filled 2017-06-04: qty 20

## 2017-06-04 MED ORDER — GELATIN ABSORBABLE 12-7 MM EX MISC
CUTANEOUS | Status: DC | PRN
  Administered 2017-06-04: 1

## 2017-06-04 MED ORDER — HYDRALAZINE HCL 20 MG/ML IJ SOLN
INTRAMUSCULAR | Status: AC
  Administered 2017-06-04: 5 mg via INTRAVENOUS
  Filled 2017-06-04: qty 1

## 2017-06-04 MED ORDER — SODIUM CHLORIDE 0.9% FLUSH
INTRAVENOUS | Status: DC | PRN
  Administered 2017-06-04: 10 mL

## 2017-06-04 MED ORDER — OXYCODONE HCL 5 MG PO TABS: 5.0000 mg | ORAL_TABLET | Freq: Once | ORAL | Status: DC | PRN

## 2017-06-04 MED ORDER — ACETAMINOPHEN ER 650 MG PO TBCR: 1300.0000 mg | EXTENDED_RELEASE_TABLET | Freq: Three times a day (TID) | ORAL | Status: DC

## 2017-06-04 MED ORDER — ONDANSETRON HCL 4 MG PO TABS: 4.0000 mg | ORAL_TABLET | Freq: Four times a day (QID) | ORAL | Status: DC | PRN

## 2017-06-04 MED ORDER — PHENOL 1.4 % MT LIQD
1.0000 | OROMUCOSAL | Status: DC | PRN
  Filled 2017-06-04: qty 177

## 2017-06-04 MED ORDER — SODIUM CHLORIDE 0.9 % IV SOLN: 250.0000 mL | INTRAVENOUS | Status: DC

## 2017-06-04 MED ORDER — SUGAMMADEX SODIUM 200 MG/2ML IV SOLN
INTRAVENOUS | Status: DC | PRN
  Administered 2017-06-04: 146 mg via INTRAVENOUS

## 2017-06-04 MED ORDER — FENTANYL CITRATE (PF) 100 MCG/2ML IJ SOLN
25.0000 ug | INTRAMUSCULAR | Status: DC | PRN
  Administered 2017-06-04: 25 ug via INTRAVENOUS

## 2017-06-04 MED ORDER — MUPIROCIN 2 % EX OINT
1.0000 "application " | TOPICAL_OINTMENT | Freq: Two times a day (BID) | CUTANEOUS | Status: DC
  Administered 2017-06-04 – 2017-06-05 (×3): 1 via NASAL
  Filled 2017-06-04: qty 22

## 2017-06-04 MED ORDER — SUCCINYLCHOLINE CHLORIDE 20 MG/ML IJ SOLN
INTRAMUSCULAR | Status: AC
  Filled 2017-06-04: qty 1

## 2017-06-04 MED ORDER — METHYLPREDNISOLONE ACETATE 40 MG/ML IJ SUSP
INTRAMUSCULAR | Status: DC | PRN
  Administered 2017-06-04: 40 mg

## 2017-06-04 MED ORDER — DEXAMETHASONE SODIUM PHOSPHATE 4 MG/ML IJ SOLN
4.0000 mg | Freq: Four times a day (QID) | INTRAMUSCULAR | Status: AC
  Filled 2017-06-04 (×3): qty 1

## 2017-06-04 MED ORDER — ONDANSETRON HCL 4 MG/2ML IJ SOLN: 4.0000 mg | Freq: Four times a day (QID) | INTRAMUSCULAR | Status: DC | PRN

## 2017-06-04 MED ORDER — EPHEDRINE SULFATE 50 MG/ML IJ SOLN
INTRAMUSCULAR | Status: DC | PRN
  Administered 2017-06-04: 100 mg via INTRAVENOUS

## 2017-06-04 MED ORDER — ROCURONIUM BROMIDE 100 MG/10ML IV SOLN
INTRAVENOUS | Status: DC | PRN
  Administered 2017-06-04: 40 mg via INTRAVENOUS
  Administered 2017-06-04 (×4): 10 mg via INTRAVENOUS

## 2017-06-04 MED ORDER — THROMBIN 5000 UNITS EX SOLR
CUTANEOUS | Status: AC
  Filled 2017-06-04: qty 10000

## 2017-06-04 MED ORDER — BUPIVACAINE-EPINEPHRINE (PF) 0.5% -1:200000 IJ SOLN
INTRAMUSCULAR | Status: DC | PRN
  Administered 2017-06-04: 7 mL

## 2017-06-04 MED ORDER — THROMBIN 5000 UNITS EX KIT
PACK | CUTANEOUS | Status: DC | PRN
  Administered 2017-06-04: 5000 [IU] via TOPICAL

## 2017-06-04 MED ORDER — TRIAMTERENE-HCTZ 75-50 MG PO TABS
1.0000 | ORAL_TABLET | Freq: Every day | ORAL | Status: DC
  Filled 2017-06-04: qty 1

## 2017-06-04 MED FILL — Thrombin For Soln 5000 Unit: CUTANEOUS | Qty: 5000 | Status: AC

## 2017-06-04 SURGICAL SUPPLY — 66 items
BLADE BOVIE TIP EXT 4 (BLADE) ×3 IMPLANT
BUR NEURO DRILL SOFT 3.0X3.8M (BURR) ×3 IMPLANT
CANISTER SUCT 1200ML W/VALVE (MISCELLANEOUS) ×6 IMPLANT
CHLORAPREP W/TINT 26ML (MISCELLANEOUS) ×3 IMPLANT
CNTNR SPEC 2.5X3XGRAD LEK (MISCELLANEOUS) ×1
CONT SPEC 4OZ STER OR WHT (MISCELLANEOUS) ×2
CONTAINER SPEC 2.5X3XGRAD LEK (MISCELLANEOUS) ×1 IMPLANT
COUNTER NEEDLE 20/40 LG (NEEDLE) ×3 IMPLANT
COVER LIGHT HANDLE STERIS (MISCELLANEOUS) ×6 IMPLANT
CUP MEDICINE 2OZ PLAST GRAD ST (MISCELLANEOUS) ×6 IMPLANT
DERMABOND ADVANCED (GAUZE/BANDAGES/DRESSINGS) ×2
DERMABOND ADVANCED .7 DNX12 (GAUZE/BANDAGES/DRESSINGS) ×1 IMPLANT
DRAPE C-ARM 42X72 X-RAY (DRAPES) ×6 IMPLANT
DRAPE INCISE IOBAN 66X45 STRL (DRAPES) ×3 IMPLANT
DRAPE LAPAROTOMY 100X77 ABD (DRAPES) ×3 IMPLANT
DRAPE MICROSCOPE SPINE 48X150 (DRAPES) ×6 IMPLANT
DRAPE POUCH INSTRU U-SHP 10X18 (DRAPES) ×3 IMPLANT
DRAPE SURG 17X11 SM STRL (DRAPES) ×12 IMPLANT
DRSG TEGADERM 4X4.75 (GAUZE/BANDAGES/DRESSINGS) ×3 IMPLANT
DRSG TELFA 4X3 1S NADH ST (GAUZE/BANDAGES/DRESSINGS) IMPLANT
DURASEAL APPLICATOR TIP (TIP) IMPLANT
DURASEAL SPINE SEALANT 3ML (MISCELLANEOUS) IMPLANT
ELECT CAUTERY BLADE TIP 2.5 (TIP) ×3
ELECT EZSTD 165MM 6.5IN (MISCELLANEOUS) ×3
ELECTRODE CAUTERY BLDE TIP 2.5 (TIP) ×1 IMPLANT
ELECTRODE EZSTD 165MM 6.5IN (MISCELLANEOUS) ×1 IMPLANT
FRAME EYE SHIELD (PROTECTIVE WEAR) ×3 IMPLANT
GLOVE BIO SURGEON STRL SZ 6.5 (GLOVE) ×6 IMPLANT
GLOVE BIO SURGEONS STRL SZ 6.5 (GLOVE) ×3
GLOVE BIOGEL PI IND STRL 7.0 (GLOVE) ×1 IMPLANT
GLOVE BIOGEL PI INDICATOR 7.0 (GLOVE) ×2
GLOVE SURG SYN 8.5  E (GLOVE) ×10
GLOVE SURG SYN 8.5 E (GLOVE) ×5 IMPLANT
GOWN SRG XL LVL 3 NONREINFORCE (GOWNS) ×1 IMPLANT
GOWN STRL NON-REIN TWL XL LVL3 (GOWNS) ×2
GOWN STRL REUS W/ TWL LRG LVL3 (GOWN DISPOSABLE) ×1 IMPLANT
GOWN STRL REUS W/TWL LRG LVL3 (GOWN DISPOSABLE) ×2
GRADUATE 1200CC STRL 31836 (MISCELLANEOUS) ×3 IMPLANT
IV CATH ANGIO 12GX3 LT BLUE (NEEDLE) ×3 IMPLANT
KIT SPINAL PRONEVIEW (KITS) ×3 IMPLANT
KNIFE BAYONET SHORT DISCETOMY (MISCELLANEOUS) IMPLANT
MARKER SKIN DUAL TIP RULER LAB (MISCELLANEOUS) ×9 IMPLANT
NDL SAFETY ECLIPSE 18X1.5 (NEEDLE) ×1 IMPLANT
NEEDLE HYPO 18GX1.5 SHARP (NEEDLE) ×2
NEEDLE HYPO 22GX1.5 SAFETY (NEEDLE) ×3 IMPLANT
NS IRRIG 1000ML POUR BTL (IV SOLUTION) ×3 IMPLANT
PACK LAMINECTOMY NEURO (CUSTOM PROCEDURE TRAY) ×3 IMPLANT
PAD ARMBOARD 7.5X6 YLW CONV (MISCELLANEOUS) ×3 IMPLANT
PATTIES SURGICAL .5X1.5 (GAUZE/BANDAGES/DRESSINGS) IMPLANT
SPOGE SURGIFLO 8M (HEMOSTASIS) ×4
SPONGE SURGIFLO 8M (HEMOSTASIS) ×2 IMPLANT
STAPLER SKIN PROX 35W (STAPLE) IMPLANT
SUT DVC VLOC 3-0 CL 6 P-12 (SUTURE) ×3 IMPLANT
SUT NURALON 4 0 TR CR/8 (SUTURE) IMPLANT
SUT VIC AB 0 CT1 27 (SUTURE) ×2
SUT VIC AB 0 CT1 27XCR 8 STRN (SUTURE) ×1 IMPLANT
SUT VIC AB 2-0 CT1 18 (SUTURE) ×3 IMPLANT
SUT VICRYL 0 UR6 27IN ABS (SUTURE) IMPLANT
SYR 20CC LL (SYRINGE) ×3 IMPLANT
SYR 30ML LL (SYRINGE) ×6 IMPLANT
SYR 3ML LL SCALE MARK (SYRINGE) ×3 IMPLANT
SYRINGE 10CC LL (SYRINGE) ×3 IMPLANT
TOWEL OR 17X26 4PK STRL BLUE (TOWEL DISPOSABLE) ×12 IMPLANT
TUBE METRX 18MMX5CM (INSTRUMENTS) ×3 IMPLANT
TUBING CONNECTING 10 (TUBING) ×2 IMPLANT
TUBING CONNECTING 10' (TUBING) ×1

## 2017-06-04 NOTE — H&P (Signed)
I have reviewed and confirmed my history and physical from 05/29/17 with no additions or changes. Plan for L4-S1 decompression.  Risks and benefits reviewed.    Heart sounds normal no MRG. Chest Clear to Auscultation Bilaterally.

## 2017-06-04 NOTE — Progress Notes (Signed)
Admission: Patient alert and oriented. Very hard of hearing. Patient has bilateral hearing aides in ears. Denies pain. Patient states he lives at home with his wife and they have a person come in and help them clean and cook weekly. Heart and lung sounds normal. Dressing clean dry and intact. Wife and daughter at bedside. Power of attorney paperwork on the chart.   Deri Fuelling, RN

## 2017-06-04 NOTE — Anesthesia Procedure Notes (Signed)
Procedure Name: Intubation Date/Time: 06/04/2017 8:00 AM Performed by: Nelda Marseille Pre-anesthesia Checklist: Patient identified, Patient being monitored, Timeout performed, Emergency Drugs available and Suction available Patient Re-evaluated:Patient Re-evaluated prior to induction Oxygen Delivery Method: Circle system utilized Preoxygenation: Pre-oxygenation with 100% oxygen Induction Type: IV induction Ventilation: Mask ventilation without difficulty Laryngoscope Size: Mac, 3 and McGraph Grade View: Grade I Tube type: Oral Tube size: 7.5 mm Number of attempts: 1 Airway Equipment and Method: Stylet and Video-laryngoscopy Placement Confirmation: ETT inserted through vocal cords under direct vision,  positive ETCO2 and breath sounds checked- equal and bilateral Secured at: 21 cm Tube secured with: Tape Dental Injury: Teeth and Oropharynx as per pre-operative assessment  Difficulty Due To: Difficulty was anticipated, Difficult Airway- due to reduced neck mobility and Difficult Airway- due to anterior larynx

## 2017-06-04 NOTE — Anesthesia Post-op Follow-up Note (Cosign Needed)
Anesthesia QCDR form completed.        

## 2017-06-04 NOTE — Op Note (Signed)
Indications: Logan Conner is a 83 yo male with history of neurogenic claudication secondary to lumbar stenosis. He failed conservative management and elected for surgical intervention.  Findings: severe overgrowth of ligamentum flavum,  Preoperative Diagnosis: Lumbar Stenosis with neurogenic claudication Postoperative Diagnosis: same   EBL: 25 ml IVF: 1100 ml Drains: none Disposition: Extubated and Stable to PACU Complications: none  No foley catheter was placed.   Preoperative Note:   Risks of surgery discussed include: infection, bleeding, stroke, coma, death, paralysis, CSF leak, nerve/spinal cord injury, numbness, tingling, weakness, complex regional pain syndrome, recurrent stenosis and/or disc herniation, vascular injury, development of instability, neck/back pain, need for further surgery, persistent symptoms, development of deformity, and the risks of anesthesia. They understood these risks and have agreed to proceed.  Operative Note:   1. L4-S1 lumbar decompression including central laminectomy and bilateral medial facetectomies including foraminotomies  The patient was then brought from the preoperative center with intravenous access established.  The patient underwent general anesthesia and endotracheal tube intubation, and was then rotated on the Keller rail top where all pressure points were appropriately padded.  The skin was then thoroughly cleansed.  Perioperative antibiotic prophylaxis was administered.  Sterile prep and drapes were then applied and a timeout was then observed.  C-arm was brought into the field under sterile conditions and under lateral visualization the L4-5 and L5-S1 interspace was identified and marked.  The incision was marked on the right and injected with local anesthetic. Once this was complete a 2 cm incision was opened with the use of a #10 blade knife.  The metrx tubes were sequentially advanced and confirmed in position. An 35mm by 83mm tube  was locked in place to the bed side attachment.  The microscope was then sterilely brought into the field and muscle creep was hemostased with a bipolar and resected with a pituitary rongeur.  A Bovie extender was then used to expose the spinous process and lamina.  Careful attention was placed to not violate the facet capsule. A 3 mm matchstick drill bit was then used to make a hemi-laminotomy trough until the ligamentum flavum was exposed.  This was extended to the base of the spinous process and to the contralateral side to remove all the central bone from each side.  Once this was complete and the underlying ligamentum flavum was visualized, it was dissected with a curette and resected with Kerrison rongeurs.  Extensive ligamentum hypertrophy was noted, requiring a substantial amount of time and care for removal.  The dura was identified and palpated. The kerrison rongeur was then used to remove the medial facet bilaterally until no compression was noted.  A balltip probe was used to confirm decompression of the right L5 nerve root.  Additional attention was paid to completion of the contralateral L4-5 foraminotomy until the left L5 nerve root was completely free.  Once this was complete, L4-5 central decompression including medial facetectomy and foraminotomy was confirmed and decompression on both sides was confirmed. No CSF leak was noted.  A Depo-Medrol soaked Gelfoam pledget was placed in the defect.  The wound was copiously irrigated. The tube system was then removed under microscopic visualization and hemostasis was obtained with a bipolar.    Using flouroscopy, the metrx tubes were sequentially advanced and confirmed in position on the right at L5-S1. An 41mm by 3mm tube was locked in place to the bed side attachment.  Fluoroscopy was then removed from the field.  The muscle creep was hemostased with a  bipolar and resected with a pituitary rongeur.  A Bovie extender was then used to expose the  spinous process and lamina.  Careful attention was placed to not violate the facet capsule. A 3 mm matchstick drill bit was then used to make a hemi-laminotomy trough until the ligamentum flavum was exposed.  This was extended to the base of the spinous process and to the contralateral side to remove all the central bone from each side.  Once this was complete and the underlying ligamentum flavum was visualized, it was dissected with a curette and resected with Kerrison rongeurs.  Extensive ligamentum hypertrophy was noted, requiring a substantial amount of time and care for removal.  The dura was identified and palpated. The kerrison rongeur was then used to remove the medial facet bilaterally until no compression was noted.  A balltip probe was used to confirm decompression of the right S1 nerve root.  Additional attention was paid to completion of the contralateral L5-S1 foraminotomy until the left S1 nerve root was completely free.  Once this was complete, L5-S1 central decompression including medial facetectomy and foraminotomy was confirmed and decompression on both sides was confirmed. No CSF leak was noted.  A Depo-Medrol soaked Gelfoam pledget was placed in the defect.  The wound was copiously irrigated. The tube system was then removed under microscopic visualization and hemostasis was obtained with a bipolar.    No CSF leak was noted throughout the case.   The fascial layer was reapproximated with the use of a 0 Vicryl suture.  Subcutaneous tissue layer was reapproximated using 2-0 Vicryl suture.  3-0 monocryl was placed in subcuticular fashion. The skin was then cleansed and Dermabond was used to close the skin opening.  Patient was then rotated back to the preoperative bed awakened from anesthesia and taken to recovery all counts are correct in this case.  I performed the entire procedure with the assistance of Marin Olp PA as an Pensions consultant. Marin Olp scrubbed in from incision  through the L4-5 decompression, then had to leave to operating room for patient care.  I completed the L5-S1 decompression without an assistant.  Camdyn Beske K. Izora Ribas MD

## 2017-06-04 NOTE — Progress Notes (Signed)
RN spoke with physical therapist this evening about evaluating the patient today since he has a routine PT eval ordered. PT said the patient was not assigned and no one will be able to evaluate him until the morning.   Deri Fuelling, RN

## 2017-06-04 NOTE — Anesthesia Postprocedure Evaluation (Signed)
Anesthesia Post Note  Patient: Logan Conner  Procedure(s) Performed: Procedure(s) (LRB): LUMBAR LAMINECTOMY/DECOMPRESSION MICRODISCECTOMY 2 LEVELS L4-S1 (Right)  Patient location during evaluation: PACU Anesthesia Type: General Level of consciousness: awake and alert Pain management: pain level controlled Vital Signs Assessment: post-procedure vital signs reviewed and stable Respiratory status: spontaneous breathing, nonlabored ventilation, respiratory function stable and patient connected to nasal cannula oxygen Cardiovascular status: blood pressure returned to baseline and stable Postop Assessment: no signs of nausea or vomiting Anesthetic complications: no     Last Vitals:  Vitals:   06/04/17 1055 06/04/17 1120  BP: (!) 166/89 (!) 180/90  Pulse: 68 72  Resp: (!) 24   Temp: (!) 36.3 C     Last Pain:  Vitals:   06/04/17 1120  TempSrc:   PainSc: 0-No pain    LLE Motor Response: Purposeful movement (06/04/17 1140) LLE Sensation: Full sensation (06/04/17 1140) RLE Motor Response: Purposeful movement (06/04/17 1140) RLE Sensation: Full sensation (06/04/17 1140)      Precious Haws Xandria Gallaga

## 2017-06-04 NOTE — NC FL2 (Signed)
[place] LEVEL OF CARE SCREENING TOOL     IDENTIFICATION  Patient Name: Logan Conner Birthdate: 09/22/1925 Sex: male Admission Date (Current Location): 06/04/2017  [place] and [place] Number:  Engineering geologist and Address:  [facility], [address], [place], [place] [address]      Provider Number: [id number]  Attending Physician Name and Address:  Meade Maw, MD  Relative Name and Phone Number:       Current Level of Care: Hospital Recommended Level of Care: [place] Prior Approval Number:    Date Approved/Denied:   PASRR Number:  [id number])  Discharge Plan: SNF    Current Diagnoses: Patient Active Problem List   Diagnosis Date Noted  . Neurogenic claudication due to lumbar spinal stenosis 06/04/2017    Orientation RESPIRATION BLADDER Height & Weight     Self, Time, Situation, Place  Normal Continent Weight: 161 lb (73 kg) Height:  5\' 10"  (177.8 cm)  BEHAVIORAL SYMPTOMS/MOOD NEUROLOGICAL BOWEL NUTRITION STATUS   (none)  (none) Continent Diet (Regular Diet )  AMBULATORY STATUS COMMUNICATION OF NEEDS Skin   Extensive Assist Verbally Surgical wounds (Incision: Back. )                       Personal Care Assistance Level of Assistance  Bathing, Feeding, Dressing Bathing Assistance: Limited assistance Feeding assistance: Independent Dressing Assistance: Limited assistance     Functional Limitations Info  Sight, Hearing, Speech Sight Info: Adequate Hearing Info: Impaired Speech Info: Adequate    SPECIAL CARE FACTORS FREQUENCY  PT (By licensed PT), OT (By licensed OT)     PT Frequency:  (5) OT Frequency:  (5)            Contractures      Additional Factors Info  Code Status, Allergies, Isolation Precautions Code Status Info:  (Full Code. ) Allergies Info:  (No Known Allergies. )     Isolation Precautions Info:  (MRSA Nasal Swab. )     Current Medications  (06/04/2017):  This is the current hospital active medication list Current Facility-Administered Medications  Medication Dose Route Frequency Provider Last Rate Last Dose  . 0.9 %  sodium chloride infusion  250 mL Intravenous Continuous Meade Maw, MD      . 0.9 %  sodium chloride infusion   Intravenous Continuous Meade Maw, MD 75 mL/hr at 06/04/17 1244    . acetaminophen (TYLENOL) tablet 1,300 mg  1,300 mg Oral Q8H Meade Maw, MD   1,300 mg at 06/04/17 1355  . brimonidine (ALPHAGAN) 0.15 % ophthalmic solution 1 drop  1 drop Both Eyes BID Meade Maw, MD   1 drop at 06/04/17 1241  . celecoxib (CELEBREX) capsule 200 mg  200 mg Oral Q12H Meade Maw, MD   200 mg at 06/04/17 1241  . Chlorhexidine Gluconate Cloth 2 % PADS 6 each  6 each Topical Q0600 Meade Maw, MD   6 each at 06/04/17 1244  . [START ON 06/05/2017] cholecalciferol (VITAMIN D) tablet 1,000 Units  1,000 Units Oral Daily Meade Maw, MD      . dexamethasone (DECADRON) injection 4 mg  4 mg Intravenous Q6H Meade Maw, MD       Or  . dexamethasone (DECADRON) tablet 4 mg  4 mg Oral Q6H Meade Maw, MD   4 mg at 06/04/17 1729  . hydroxychloroquine (PLAQUENIL) tablet 200 mg  200 mg Oral Daily Meade Maw, MD   200 mg at 06/04/17  1241  . irbesartan (AVAPRO) tablet 37.5 mg  37.5 mg Oral Daily Meade Maw, MD   37.5 mg at 06/04/17 1242  . latanoprost (XALATAN) 0.005 % ophthalmic solution 1 drop  1 drop Both Eyes QHS Meade Maw, MD      . Derrill Memo ON 06/05/2017] levothyroxine (SYNTHROID, LEVOTHROID) tablet 50 mcg  50 mcg Oral QAC breakfast Meade Maw, MD      . menthol-cetylpyridinium (CEPACOL) lozenge 3 mg  1 lozenge Oral PRN Meade Maw, MD       Or  . phenol (CHLORASEPTIC) mouth spray 1 spray  1 spray Mouth/Throat PRN Meade Maw, MD      . methocarbamol (ROBAXIN) tablet 500 mg  500 mg Oral Q6H PRN Meade Maw, MD       Or  .  methocarbamol (ROBAXIN) 500 mg in dextrose 5 % 50 mL IVPB  500 mg Intravenous Q6H PRN Meade Maw, MD      . metoprolol succinate (TOPROL-XL) 24 hr tablet 50 mg  50 mg Oral Daily Meade Maw, MD   50 mg at 06/04/17 1242  . mometasone-formoterol (DULERA) 200-5 MCG/ACT inhaler 2 puff  2 puff Inhalation BID Meade Maw, MD   2 puff at 06/04/17 1242  . montelukast (SINGULAIR) tablet 10 mg  10 mg Oral QHS Meade Maw, MD      . morphine 2 MG/ML injection 1 mg  1 mg Intravenous Q3H PRN Meade Maw, MD      . Derrill Memo ON 06/05/2017] multivitamin with minerals tablet 1 tablet  1 tablet Oral Daily Meade Maw, MD      . mupirocin ointment (BACTROBAN) 2 % 1 application  1 application Nasal BID Meade Maw, MD   1 application at 00/37/04 1300  . ondansetron (ZOFRAN) tablet 4 mg  4 mg Oral Q6H PRN Meade Maw, MD       Or  . ondansetron [facility]) injection 4 mg  4 mg Intravenous Q6H PRN Meade Maw, MD      . oxyCODONE (Oxy IR/ROXICODONE) immediate release tablet 5 mg  5 mg Oral Q3H PRN Meade Maw, MD      . pantoprazole (PROTONIX) EC tablet 40 mg  40 mg Oral Daily Meade Maw, MD   40 mg at 06/04/17 1241  . simvastatin (ZOCOR) tablet 40 mg  40 mg Oral QHS Meade Maw, MD      . sodium chloride flush (NS) 0.9 % injection 3 mL  3 mL Intravenous Q12H Meade Maw, MD      . sodium chloride flush (NS) 0.9 % injection 3 mL  3 mL Intravenous PRN Meade Maw, MD      . triamterene-hydrochlorothiazide (UGQBVQX-45) 37.5-25 MG per tablet 2 tablet  2 tablet Oral Daily Meade Maw, MD   2 tablet at 06/04/17 1242     Discharge Medications: Please see discharge summary for a list of discharge medications.  Relevant Imaging Results:  Relevant Lab Results:   Additional Information  (SSN: [id number])  Kamill Fulbright, Veronia Beets, LCSW

## 2017-06-04 NOTE — Anesthesia Preprocedure Evaluation (Signed)
Anesthesia Evaluation  Patient identified by MRN, date of birth, ID band Patient awake    Reviewed: Allergy & Precautions, H&P , NPO status , Patient's Chart, lab work & pertinent test results  History of Anesthesia Complications Negative for: history of anesthetic complications  Airway Mallampati: III  TM Distance: >3 FB Neck ROM: limited    Dental  (+) Poor Dentition, Chipped   Pulmonary neg shortness of breath, asthma , COPD, former smoker,           Cardiovascular Exercise Tolerance: Good hypertension, (-) angina(-) Past MI and (-) DOE      Neuro/Psych negative neurological ROS  negative psych ROS   GI/Hepatic negative GI ROS, Neg liver ROS, neg GERD  ,  Endo/Other  Hyperthyroidism   Renal/GU      Musculoskeletal  (+) Arthritis ,   Abdominal   Peds  Hematology negative hematology ROS (+)   Anesthesia Other Findings Signs and symptoms suggestive of sleep apnea   Past Medical History: No date: Arthritis No date: Glaucoma No date: Hyperlipidemia No date: Hypertension No date: Hyperthyroidism  Past Surgical History: No date: CATARACT EXTRACTION; Right No date: kidney stone No date: TOTAL HIP ARTHROPLASTY; Right  BMI    Body Mass Index:  23.10 kg/m      Reproductive/Obstetrics negative OB ROS                             Anesthesia Physical Anesthesia Plan  ASA: III  Anesthesia Plan: General ETT   Post-op Pain Management:    Induction: Intravenous  PONV Risk Score and Plan: 2 and Ondansetron and Dexamethasone  Airway Management Planned: Oral ETT  Additional Equipment:   Intra-op Plan:   Post-operative Plan: Extubation in OR and Possible Post-op intubation/ventilation  Informed Consent: I have reviewed the patients History and Physical, chart, labs and discussed the procedure including the risks, benefits and alternatives for the proposed anesthesia with the  patient or authorized representative who has indicated his/her understanding and acceptance.   Dental Advisory Given  Plan Discussed with: Anesthesiologist, CRNA and Surgeon  Anesthesia Plan Comments: (Patient and family informed that patient is higher risk for complications from anesthesia during this procedure due to their medical history and age including but not limited to post operative cognitive dysfunction.  They voiced understanding.  Patient consented for risks of anesthesia including but not limited to:  - adverse reactions to medications - damage to teeth, lips or other oral mucosa - sore throat or hoarseness - Damage to heart, brain, lungs or loss of life  Patient voiced understanding.)        Anesthesia Quick Evaluation

## 2017-06-04 NOTE — Transfer of Care (Signed)
Immediate Anesthesia Transfer of Care Note  Patient: Logan Conner  Procedure(s) Performed: Procedure(s): LUMBAR LAMINECTOMY/DECOMPRESSION MICRODISCECTOMY 2 LEVELS L4-S1 (Right)  Patient Location: PACU  Anesthesia Type:General  Level of Consciousness: awake, alert  and sedated  Airway & Oxygen Therapy: Patient Spontanous Breathing and Patient connected to face mask oxygen  Post-op Assessment: Report given to RN and Post -op Vital signs reviewed and stable  Post vital signs: Reviewed and stable  Last Vitals:  Vitals:   06/04/17 0558  BP: (!) 187/89  Pulse: 78  Resp: 16  Temp: 36.6 C    Last Pain:  Vitals:   06/04/17 0558  TempSrc: Tympanic  PainSc: 2          Complications: No apparent anesthesia complications

## 2017-06-04 NOTE — Care Management Obs Status (Signed)
[place] NOTIFICATION   Patient Details  Name: Logan Conner MRN: [id number] Date of Birth: 19-Jan-1925   Medicare Observation Status Notification Given:  Yes    Jolly Mango, RN 06/04/2017, 1:54 PM

## 2017-06-04 NOTE — Progress Notes (Signed)
    Attending Progress Note  History: Logan Conner is here for neurogenic claudication secondary to lumbar stenosis. He is POD0 from decompression.  Physical Exam: Vitals:   06/04/17 1120 06/04/17 1202  BP: (!) 180/90 (!) 153/57  Pulse: 72 69  Resp:    Temp:      AA Ox3 CNI  Strength:5/5 throughout BLE Incision c/d/i  Data:  No results for input(s): NA, K, CL, CO2, BUN, CREATININE, LABGLOM, GLUCOSE, CALCIUM in the last 168 hours. No results for input(s): AST, ALT, ALKPHOS in the last 168 hours.  Invalid input(s): TBILI   No results for input(s): WBC, HGB, HCT, PLT in the last 168 hours.  Recent Labs Lab 06/02/17 1236  APTT 34  INR 1.07         Other tests/results: none  Assessment/Plan:  Logan Conner is doing well.  - mobilize - pain control - DVT prophylaxis - PTOT   Meade Maw MD, [facility] Department of Neurosurgery

## 2017-06-04 NOTE — OR Nursing (Signed)
Lab tech in to draw T&S ordered by MD when in to see patient in preop (drawn @ [phone])

## 2017-06-05 DIAGNOSIS — M48062 Spinal stenosis, lumbar region with neurogenic claudication: Secondary | ICD-10-CM | POA: Diagnosis not present

## 2017-06-05 MED ORDER — METHOCARBAMOL 500 MG PO TABS: 500.0000 mg | ORAL_TABLET | Freq: Four times a day (QID) | ORAL | 0 refills | Status: DC | PRN

## 2017-06-05 MED ORDER — CELECOXIB 200 MG PO CAPS: 200.0000 mg | ORAL_CAPSULE | Freq: Two times a day (BID) | ORAL | 0 refills | Status: DC

## 2017-06-05 MED ORDER — OXYCODONE HCL 5 MG PO TABS: 5.0000 mg | ORAL_TABLET | ORAL | 0 refills | Status: DC | PRN

## 2017-06-05 MED ORDER — MUPIROCIN 2 % EX OINT: 1.0000 "application " | TOPICAL_OINTMENT | Freq: Two times a day (BID) | CUTANEOUS | 0 refills | Status: DC

## 2017-06-05 MED ORDER — METHYLPREDNISOLONE 4 MG PO TBPK: ORAL_TABLET | ORAL | 0 refills | Status: DC

## 2017-06-05 NOTE — Progress Notes (Signed)
Clinical Social Worker (CSW) received SNF consult. PT is recommending home health. RN case manager aware of above. Please reconsult if future social work needs arise. CSW signing off.   Shelton Soler, LCSW [phone] 

## 2017-06-05 NOTE — Evaluation (Signed)
Physical Therapy Evaluation Patient Details Name: Logan Conner MRN: [id number] DOB: [birth year] Today's Date: 06/05/2017   History of Present Illness  Pt is POD#1 s/p L4-S1 lumbar decompression including laminectomies, facetectomies, and foraminotomies. No reported post-op complications. Pt evaluated on POD#1.  Clinical Impression  Pt admitted with above diagnosis. Pt currently with functional limitations due to the deficits listed below (see PT Problem List). Pt is modified independent with bed mobility but requires cues for proper log rolling technique. He requires 2 attempts to come to standing. He braces the back of his legs on the bed and is initially unsteady in standing with posterior lean and minA+1 support. With increased time he is able to remain in standing without external support. Pt initially mildly unsteady with ambulation requiring infrequent minA+1 to help stabilize. With increased distance his stability improves and he is able to self-stabilize using a rolling walker. Denies DOE with ambulation and no increase in back pain. Cues provided to stand within walker for safety. Pt has a history of at least one fall in the last year but family indicates that they believe he has had more. He would benefit from a rolling walker for added stability with ambulation. He would also benefit from [facility] PT to help work on his LE strength, balance, and general safety with mobility in the home. He is a high risk for future falls. Daughters would like [facility] PT after discharge. Care manager notified of discharge recommendations. Pt will benefit from PT services to address deficits in strength, balance, and mobility in order to return to full function at home.       Follow Up Recommendations Home health PT    Equipment Recommendations  Rolling walker with 5" wheels    Recommendations for Other Services OT consult     Precautions / Restrictions Precautions Precautions: Fall;Back Precaution Booklet  Issued: Yes (comment) Precaution Comments: No bending, lifting, twisting Restrictions Weight Bearing Restrictions: No      Mobility  Bed Mobility Overal bed mobility: Modified Independent             General bed mobility comments: Education provided to patient regarding log-rolling technique to enter/exit bed. Pt able to demonstrate with verbal and tactile cues. HOB minimally elevated, bed rails utilized, and increased time required  Transfers Overall transfer level: Needs assistance Equipment used: Rolling walker (2 wheeled) Transfers: Sit to/from Stand Sit to Stand: Min guard         General transfer comment: Pt requires 2 attempts to come to standing. He braces the back of his legs on the bed and is initially unsteady in standing with posterior lean and minA+1 support. With increased time he is able to remain in standing without external support  Ambulation/Gait Ambulation/Gait assistance: Min assist Ambulation Distance (Feet): 200 Feet Assistive device: Rolling walker (2 wheeled) Gait Pattern/deviations: Decreased step length - right;Decreased step length - left Gait velocity: Decreased Gait velocity interpretation: <1.8 ft/sec, indicative of risk for recurrent falls General Gait Details: Pt initially mildly unsteady with ambulation requiring infrequent minA+1 to help stabilize. With increased distance his stability improves and he is able to self-stabilize using a rolling walker. Denies DOE with ambulation and no increase in back pain. Cues provided to stand within walker for safety.   Stairs            Wheelchair Mobility    Modified Rankin (Stroke Patients Only)       Balance Overall balance assessment: Needs assistance Sitting-balance support: No upper extremity supported  Sitting balance-Leahy Scale: Good     Standing balance support: Bilateral upper extremity supported Standing balance-Leahy Scale: Fair Standing balance comment: Initially somewhat  poor but improves with extended time in standing with UE support. He is able to maintain wide stance without UE support however loses balance when attempting to place his feet together. Unable to perform single leg balance                             Pertinent Vitals/Pain Pain Assessment: No/denies pain    Home Living Family/patient expects to be discharged to:: Private residence Living Arrangements: Spouse/significant other Available Help at Discharge: Family;Available 24 hours/day;Other (Comment);Personal care attendant (Will have 24/7 attendant initially) Type of Home: Other(Comment) (condo) Home Access: Stairs to enter Entrance Stairs-Rails: Right Entrance Stairs-Number of Steps: 4 Home Layout: Multi-level;Able to live on main level with bedroom/bathroom Home Equipment: Kasandra Knudsen - single point;Shower seat;Grab bars - tub/shower;Grab bars - toilet;Bedside commode;Walker - standard (no walker)      Prior Function Level of Independence: Needs assistance   Gait / Transfers Assistance Needed: Ambulates with single point cane. At least one fall in the last 12 months but from daughter report it sounds like there have been more  ADL's / Homemaking Assistance Needed: Independent with ADLs, assist with IADLs from family and cleaner        Hand Dominance   Dominant Hand: Right    Extremity/Trunk Assessment   Upper Extremity Assessment Upper Extremity Assessment: Overall WFL for tasks assessed    Lower Extremity Assessment Lower Extremity Assessment: Overall WFL for tasks assessed       Communication   Communication: No difficulties  Cognition Arousal/Alertness: Awake/alert Behavior During Therapy: WFL for tasks assessed/performed Overall Cognitive Status: Within Functional Limits for tasks assessed                                        General Comments      Exercises     Assessment/Plan    PT Assessment Patient needs continued PT services   PT Problem List Decreased strength;Decreased balance;Decreased activity tolerance;Decreased mobility;Decreased knowledge of use of DME       PT Treatment Interventions DME instruction;Gait training;Stair training;Functional mobility training;Therapeutic activities;Therapeutic exercise;Balance training;Neuromuscular re-education;Patient/family education    PT Goals (Current goals can be found in the Care Plan section)  Acute Rehab PT Goals Patient Stated Goal: Return to prior function at home PT Goal Formulation: With patient/family Time For Goal Achievement: 06/19/17 Potential to Achieve Goals: Good    Frequency 7X/week   Barriers to discharge        Co-evaluation               AM-PAC PT "6 Clicks" Daily Activity  Outcome Measure Difficulty turning over in bed (including adjusting bedclothes, sheets and blankets)?: A Little Difficulty moving from lying on back to sitting on the side of the bed? : A Little Difficulty sitting down on and standing up from a chair with arms (e.g., wheelchair, bedside commode, etc,.)?: A Little Help needed moving to and from a bed to chair (including a wheelchair)?: A Little Help needed walking in hospital room?: A Little Help needed climbing 3-5 steps with a railing? : A Little 6 Click Score: 18    End of Session Equipment Utilized During Treatment: Gait belt Activity Tolerance: Patient tolerated treatment well  Patient left: in chair;with call bell/phone within reach;with chair alarm set;with family/visitor present;with SCD's reapplied Nurse Communication: Other (comment) (Care manager notified of need for [facility] PT and RW) PT Visit Diagnosis: Unsteadiness on feet (R26.81);History of falling (Z91.81);Muscle weakness (generalized) (M62.81)    Time: 8727-6184 PT Time Calculation (min) (ACUTE ONLY): 42 min   Charges:   PT Evaluation $PT Eval Low Complexity: 1 Procedure PT Treatments $Gait Training: 8-22 mins   PT G Codes:   PT G-Codes  **NOT FOR INPATIENT CLASS** Functional Assessment Tool Used: AM-PAC 6 Clicks Basic Mobility Functional Limitation: Mobility: Walking and moving around Mobility: Walking and Moving Around Current Status (Q5927): At least 40 percent but less than 60 percent impaired, limited or restricted Mobility: Walking and Moving Around Goal Status [phone]): At least 20 percent but less than 40 percent impaired, limited or restricted    Phillips Grout PT, DPT    Logan Conner 06/05/2017, 10:37 AM

## 2017-06-05 NOTE — Discharge Instructions (Signed)

## 2017-06-05 NOTE — Discharge Summary (Signed)
   History: Logan Conner is POD1 for lumbar decompression. He tolerated the procedure well.  He did walk yesterday and felt a little off balance that resolved the longer he walked. Denies pain or tenderness at incision site.  Physical Exam: Vitals:   06/04/17 2354 06/05/17 0730  BP: (!) 153/87 (!) 150/72  Pulse: 77 71  Resp:  20  Temp: 99.2 F (37.3 C) 98.6 F (37 C)    AA Ox3 Incision c/d/i Strength:4+/5 throughout  Sensation: intact and symmetric all extremities.    Data:  No results for input(s): NA, K, CL, CO2, BUN, CREATININE, LABGLOM, GLUCOSE, CALCIUM in the last 168 hours. No results for input(s): AST, ALT, ALKPHOS in the last 168 hours.  Invalid input(s): TBILI   No results for input(s): WBC, HGB, HCT, PLT in the last 168 hours.  Recent Labs Lab 06/02/17 1236  APTT 34  INR 1.07         Assessment/Plan:  Logan Conner is doing well POD1 without complaints.   - mobilize - pain control    Marin Olp PA-C Department of Neurosurgery

## 2017-06-05 NOTE — Care Management Note (Addendum)
Case Management Note  Patient Details  Name: Logan Conner MRN: [id number] Date of Birth: [birth year]  Subjective/Objective:  Met with patient and his daughters at bedside to discuss discharge planning. PT recommending home health PT. Spoke with Dr. Izora Ribas and his nurse. Patient has been set up with [facility]. He will need a walker .Ordered from [facility]. Lives at home with his wife. Daughters supportive. PCP is Emily Filbert.                   Action/Plan: [facility] for HHPT ans walker   Expected Discharge Date:                  Expected Discharge Plan:  [place]  In-House Referral:     Discharge planning Services  CM Consult  Post Acute Care Choice:  Durable Medical Equipment, Home Health Choice offered to:  Patient, Adult Children  DME Arranged:  Walker rolling DME Agency:  [place]:  PT [place] Agency:  [facility]  Status of Service:  Completed, signed off  If discussed at [place] of Stay Meetings, dates discussed:    Additional Comments:  Jolly Mango, RN 06/05/2017, 10:52 AM

## 2017-06-05 NOTE — Progress Notes (Addendum)
Pt discharged to home via wheelchair without incident per MD order accompanied by family. Prior to discharge all discharge teachings done both written and verbal and all questions answered. Family and pt verbalize understanding and agree to comply. Pt discharged with prescription for oxycodone. On discharge, no change in patient from AM assessment. Pt pain controlled on discharge.

## 2017-06-10 ENCOUNTER — Emergency Department: Payer: Medicare Other

## 2017-06-10 ENCOUNTER — Observation Stay
Admission: EM | Admit: 2017-06-10 | Discharge: 2017-06-11 | Disposition: A | Discharge status: Skilled Nursing Facility | Payer: Medicare Other | Attending: Internal Medicine | Admitting: Internal Medicine

## 2017-06-10 DIAGNOSIS — H9190 Unspecified hearing loss, unspecified ear: Secondary | ICD-10-CM | POA: Diagnosis not present

## 2017-06-10 DIAGNOSIS — Z87891 Personal history of nicotine dependence: Secondary | ICD-10-CM | POA: Diagnosis not present

## 2017-06-10 DIAGNOSIS — I10 Essential (primary) hypertension: Secondary | ICD-10-CM | POA: Diagnosis not present

## 2017-06-10 DIAGNOSIS — E785 Hyperlipidemia, unspecified: Secondary | ICD-10-CM | POA: Insufficient documentation

## 2017-06-10 DIAGNOSIS — Z96641 Presence of right artificial hip joint: Secondary | ICD-10-CM | POA: Insufficient documentation

## 2017-06-10 DIAGNOSIS — R262 Difficulty in walking, not elsewhere classified: Secondary | ICD-10-CM | POA: Insufficient documentation

## 2017-06-10 DIAGNOSIS — Z791 Long term (current) use of non-steroidal anti-inflammatories (NSAID): Secondary | ICD-10-CM | POA: Insufficient documentation

## 2017-06-10 DIAGNOSIS — M545 Low back pain, unspecified: Secondary | ICD-10-CM

## 2017-06-10 DIAGNOSIS — M1990 Unspecified osteoarthritis, unspecified site: Secondary | ICD-10-CM | POA: Insufficient documentation

## 2017-06-10 DIAGNOSIS — H409 Unspecified glaucoma: Secondary | ICD-10-CM | POA: Diagnosis not present

## 2017-06-10 DIAGNOSIS — M25551 Pain in right hip: Secondary | ICD-10-CM | POA: Diagnosis present

## 2017-06-10 DIAGNOSIS — E0590 Thyrotoxicosis, unspecified without thyrotoxic crisis or storm: Secondary | ICD-10-CM | POA: Insufficient documentation

## 2017-06-10 DIAGNOSIS — M48061 Spinal stenosis, lumbar region without neurogenic claudication: Secondary | ICD-10-CM | POA: Insufficient documentation

## 2017-06-10 DIAGNOSIS — Z79899 Other long term (current) drug therapy: Secondary | ICD-10-CM | POA: Diagnosis not present

## 2017-06-10 LAB — BASIC METABOLIC PANEL
ANION GAP: 10 (ref 5–15)
BUN: 27 mg/dL — ABNORMAL HIGH (ref 6–20)
CALCIUM: 9.5 mg/dL (ref 8.9–10.3)
CHLORIDE: 108 mmol/L (ref 101–111)
CO2: 23 mmol/L (ref 22–32)
CREATININE: 0.99 mg/dL (ref 0.61–1.24)
GFR calc non Af Amer: >60 mL/min (ref >60)
Glucose, Bld: 150 mg/dL — ABNORMAL HIGH (ref 65–99)
Potassium: 4.6 mmol/L (ref 3.5–5.1)
SODIUM: 141 mmol/L (ref 135–145)

## 2017-06-10 LAB — CBC
HCT: 42.9 % (ref 40.0–52.0)
HEMOGLOBIN: 14.6 g/dL (ref 13.0–18.0)
MCH: 33.5 pg (ref 26.0–34.0)
MCHC: 34.1 g/dL (ref 32.0–36.0)
MCV: 98.3 fL (ref 80.0–100.0)
PLATELETS: 204 10*3/uL (ref 150–440)
RBC: 4.36 MIL/uL — AB (ref 4.40–5.90)
RDW: 13 % (ref 11.5–14.5)
WBC: 12.1 10*3/uL — AB (ref 3.8–10.6)

## 2017-06-10 MED ORDER — CELECOXIB 200 MG PO CAPS
200.0000 mg | ORAL_CAPSULE | Freq: Two times a day (BID) | ORAL | Status: DC
  Administered 2017-06-10 – 2017-06-11 (×3): 200 mg via ORAL
  Filled 2017-06-10 (×4): qty 1

## 2017-06-10 MED ORDER — SODIUM CHLORIDE 0.9% FLUSH: 3.0000 mL | INTRAVENOUS | Status: DC | PRN

## 2017-06-10 MED ORDER — LEVOTHYROXINE SODIUM 50 MCG PO TABS
50.0000 ug | ORAL_TABLET | Freq: Every day | ORAL | Status: DC
  Administered 2017-06-11: 50 ug via ORAL
  Filled 2017-06-10: qty 1

## 2017-06-10 MED ORDER — MORPHINE SULFATE (PF) 2 MG/ML IV SOLN
INTRAVENOUS | Status: AC
  Filled 2017-06-10: qty 1

## 2017-06-10 MED ORDER — MOMETASONE FURO-FORMOTEROL FUM 200-5 MCG/ACT IN AERO
2.0000 | INHALATION_SPRAY | Freq: Two times a day (BID) | RESPIRATORY_TRACT | Status: DC
  Administered 2017-06-10 – 2017-06-11 (×2): 2 via RESPIRATORY_TRACT
  Filled 2017-06-10: qty 8.8

## 2017-06-10 MED ORDER — METHOCARBAMOL 500 MG PO TABS: 500.0000 mg | ORAL_TABLET | Freq: Four times a day (QID) | ORAL | Status: DC | PRN

## 2017-06-10 MED ORDER — POLYETHYLENE GLYCOL 3350 17 G PO PACK: 17.0000 g | PACK | Freq: Every day | ORAL | Status: DC | PRN

## 2017-06-10 MED ORDER — ALBUTEROL SULFATE (2.5 MG/3ML) 0.083% IN NEBU: 2.5000 mg | INHALATION_SOLUTION | RESPIRATORY_TRACT | Status: DC | PRN

## 2017-06-10 MED ORDER — DEXAMETHASONE SODIUM PHOSPHATE 10 MG/ML IJ SOLN
10.0000 mg | Freq: Once | INTRAMUSCULAR | Status: AC
  Administered 2017-06-10: 10 mg via INTRAVENOUS
  Filled 2017-06-10: qty 1

## 2017-06-10 MED ORDER — MORPHINE SULFATE (PF) 2 MG/ML IV SOLN
2.0000 mg | Freq: Once | INTRAVENOUS | Status: AC
  Administered 2017-06-10: 2 mg via INTRAVENOUS

## 2017-06-10 MED ORDER — ONDANSETRON HCL 4 MG/2ML IJ SOLN
4.0000 mg | Freq: Once | INTRAMUSCULAR | Status: AC
  Administered 2017-06-10: 4 mg via INTRAVENOUS

## 2017-06-10 MED ORDER — KETOROLAC TROMETHAMINE 30 MG/ML IJ SOLN: 30.0000 mg | Freq: Once | INTRAMUSCULAR | Status: DC

## 2017-06-10 MED ORDER — METOPROLOL SUCCINATE ER 50 MG PO TB24
50.0000 mg | ORAL_TABLET | Freq: Every day | ORAL | Status: DC
  Administered 2017-06-10 – 2017-06-11 (×2): 50 mg via ORAL
  Filled 2017-06-10 (×2): qty 1

## 2017-06-10 MED ORDER — ACETAMINOPHEN 650 MG RE SUPP: 650.0000 mg | Freq: Four times a day (QID) | RECTAL | Status: DC | PRN

## 2017-06-10 MED ORDER — MORPHINE SULFATE (PF) 2 MG/ML IV SOLN: 2.0000 mg | Freq: Once | INTRAVENOUS | Status: DC

## 2017-06-10 MED ORDER — MONTELUKAST SODIUM 10 MG PO TABS
10.0000 mg | ORAL_TABLET | Freq: Every day | ORAL | Status: DC
  Administered 2017-06-10: 10 mg via ORAL
  Filled 2017-06-10: qty 1

## 2017-06-10 MED ORDER — BISACODYL 10 MG RE SUPP
10.0000 mg | Freq: Every day | RECTAL | Status: DC | PRN
  Filled 2017-06-10: qty 1

## 2017-06-10 MED ORDER — MORPHINE SULFATE (PF) 2 MG/ML IV SOLN
INTRAVENOUS | Status: AC
  Administered 2017-06-10: 2 mg via INTRAVENOUS
  Filled 2017-06-10: qty 1

## 2017-06-10 MED ORDER — IRBESARTAN 150 MG PO TABS
75.0000 mg | ORAL_TABLET | Freq: Every day | ORAL | Status: DC
  Administered 2017-06-10 – 2017-06-11 (×2): 75 mg via ORAL
  Filled 2017-06-10 (×2): qty 1

## 2017-06-10 MED ORDER — HYDROXYCHLOROQUINE SULFATE 200 MG PO TABS
200.0000 mg | ORAL_TABLET | Freq: Every day | ORAL | Status: DC
  Administered 2017-06-10 – 2017-06-11 (×2): 200 mg via ORAL
  Filled 2017-06-10 (×2): qty 1

## 2017-06-10 MED ORDER — SIMVASTATIN 20 MG PO TABS
40.0000 mg | ORAL_TABLET | Freq: Every day | ORAL | Status: DC
  Administered 2017-06-10: 40 mg via ORAL
  Filled 2017-06-10: qty 2

## 2017-06-10 MED ORDER — SODIUM CHLORIDE 0.9 % IV SOLN
250.0000 mL | INTRAVENOUS | Status: DC | PRN
Start: 2017-06-10 — End: 2017-06-11

## 2017-06-10 MED ORDER — ONDANSETRON HCL 4 MG/2ML IJ SOLN: 4.0000 mg | Freq: Four times a day (QID) | INTRAMUSCULAR | Status: DC | PRN

## 2017-06-10 MED ORDER — SODIUM CHLORIDE 0.9% FLUSH
3.0000 mL | Freq: Two times a day (BID) | INTRAVENOUS | Status: DC
  Administered 2017-06-10 – 2017-06-11 (×3): 3 mL via INTRAVENOUS

## 2017-06-10 MED ORDER — ACETAMINOPHEN 325 MG PO TABS: 650.0000 mg | ORAL_TABLET | Freq: Four times a day (QID) | ORAL | Status: DC | PRN

## 2017-06-10 MED ORDER — ONDANSETRON HCL 4 MG/2ML IJ SOLN
INTRAMUSCULAR | Status: AC
  Filled 2017-06-10: qty 2

## 2017-06-10 MED ORDER — OXYCODONE HCL 5 MG PO TABS: 5.0000 mg | ORAL_TABLET | ORAL | Status: DC | PRN

## 2017-06-10 MED ORDER — ONDANSETRON HCL 4 MG PO TABS: 4.0000 mg | ORAL_TABLET | Freq: Four times a day (QID) | ORAL | Status: DC | PRN

## 2017-06-10 MED ORDER — LATANOPROST 0.005 % OP SOLN
1.0000 [drp] | Freq: Every day | OPHTHALMIC | Status: DC
  Administered 2017-06-10: 1 [drp] via OPHTHALMIC
  Filled 2017-06-10: qty 2.5

## 2017-06-10 MED ORDER — BRIMONIDINE TARTRATE 0.15 % OP SOLN
1.0000 [drp] | Freq: Two times a day (BID) | OPHTHALMIC | Status: DC
  Administered 2017-06-10 – 2017-06-11 (×3): 1 [drp] via OPHTHALMIC
  Filled 2017-06-10: qty 5

## 2017-06-10 MED ORDER — PANTOPRAZOLE SODIUM 40 MG PO TBEC
40.0000 mg | DELAYED_RELEASE_TABLET | Freq: Every day | ORAL | Status: DC
  Administered 2017-06-10 – 2017-06-11 (×2): 40 mg via ORAL
  Filled 2017-06-10 (×2): qty 1

## 2017-06-10 NOTE — Care Management Obs Status (Signed)
[place] NOTIFICATION   Patient Details  Name: Logan Conner MRN: [id number] Date of Birth: 12/05/1924   Medicare Observation Status Notification Given:  Yes    Jolly Mango, RN 06/10/2017, 11:22 AM

## 2017-06-10 NOTE — Evaluation (Signed)
Physical Therapy Evaluation Patient Details Name: Logan Conner MRN: [id number] DOB: December 21, 1924 Today's Date: 06/10/2017   History of Present Illness  Pt is a 83 yo M, admitted to acute care on 7/31 for R hip pain. Prior to [place], pt modI with amb, utilizing RW. Of note, pt underwent lumbar laminectomy/decompression microdiscectomy last week, and was receiving HHPT prior to admission. PMH: arthritis, glaucoma, HLD, HTN, and hyperthyroidism.   Clinical Impression  Pt is pleasant and eager to participate for return to PLOF. Pt performs bed mobility, tranfers, and ambulation with MinA due to impaired strength, endurance, balance, sequencing, and pain. Amb total of 40 ft with B UE support from RW, with no rest break, primarily limited by impaired strength and pain. Overall, pt responded well to today's treatment with no adverse affects. Pt would benefit from skilled PT to address the previously mentioned impairments and promote return to PLOF. Currently recommending SNF, pending d/c, based on the previously mentioned impairments. This entire session was guided, instructed, and directly supervised by Cy Blamer, DPT.      Follow Up Recommendations SNF    Equipment Recommendations  None recommended by PT    Recommendations for Other Services       Precautions / Restrictions Precautions Precautions: Fall;Back Precaution Booklet Issued: No Precaution Comments: No bending, lifting, twisting Restrictions Weight Bearing Restrictions: No      Mobility  Bed Mobility Overal bed mobility: Needs Assistance Bed Mobility: Supine to Sit     Supine to sit: Min assist     General bed mobility comments: MinA with supine to sit, requiring min verbal/tactile/visual cues for mechanics and safety. Pt education re: log rolling technique. Pt verbalized understanding, but required physical assist to perform   Transfers Overall transfer level: Needs assistance Equipment used: Rolling  walker (2 wheeled) Transfers: Sit to/from Stand Sit to Stand: Min assist         General transfer comment: MinA with STS transfer, with heavy reliance on leaning back of LE's on EOB upon standing, to gain balance; requires mod cues for mechanics and safety. Pt education re: "nose over toes," and hand/ft placement. Pt verbalized understanding, but was hesitant to flex at hips for leaning forward.  Ambulation/Gait Ambulation/Gait assistance: Min assist Ambulation Distance (Feet): 40 Feet Assistive device: Rolling walker (2 wheeled) Gait Pattern/deviations: Step-through pattern;Decreased step length - right;Decreased step length - left Gait velocity: Decreased   General Gait Details: Pt initially with increased postural sway, requiring increased time to initiate amb. Reports increased pain to R hip with WB. Once initiating gait, pt with good mechanics using RW.   Stairs            Wheelchair Mobility    Modified Rankin (Stroke Patients Only)       Balance Overall balance assessment: Needs assistance Sitting-balance support: Bilateral upper extremity supported;Feet supported Sitting balance-Leahy Scale: Fair Sitting balance - Comments: Pt required CGA-MinA with sitting balance. Initially requiring MINA, progressing to CGA with min cue to sit with erect posture and keep B ft on the floor for added stability.    Standing balance support: Bilateral upper extremity supported Standing balance-Leahy Scale: Fair Standing balance comment: Fair standing balance, requiring b UE support from RW and demonstratign increased post postural sway with immediate standing, which required minA to correct.                              Pertinent Vitals/Pain Pain Assessment:  Faces Faces Pain Scale: Hurts little more Pain Location: R hip  (with movement and WB) Pain Descriptors / Indicators: Discomfort;Nagging;Grimacing Pain Intervention(s): Monitored during session;Limited activity  within patient's tolerance;Premedicated before session    [place] expects to be discharged to:: Private residence Living Arrangements: Spouse/significant other Available Help at Discharge: Family;Available 24 hours/day;Other (Comment);Personal care attendant Type of Home: House Home Access: Stairs to enter Entrance Stairs-Rails: Right Entrance Stairs-Number of Steps: 4 Home Layout: Able to live on main level with bedroom/bathroom Home Equipment: Cane - single point;Walker - 2 wheels Additional Comments: 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 Pt seeing HHPT 3x/wk prior to current admission. Per pt daughter: 24 hr assist does not include physical assistance; they are an aid for cooking and cleaning.     Prior Function Level of Independence: Needs assistance               Hand Dominance        Extremity/Trunk Assessment   Upper Extremity Assessment Upper Extremity Assessment: Overall WFL for tasks assessed    Lower Extremity Assessment Lower Extremity Assessment: Generalized weakness (MMT to B LE's grossly 4/5)       Communication   Communication: No difficulties  Cognition Arousal/Alertness: Awake/alert Behavior During Therapy: WFL for tasks assessed/performed Overall Cognitive Status: Within Functional Limits for tasks assessed                                        General Comments      Exercises Other Exercises Other Exercises: Supine therex performed to B LE's with supervision x 10 reps: ankle pumps, SLR, hip abd, and glute sets. Pt with good mechancis and safety, requiring no cues.    Assessment/Plan    PT Assessment Patient needs continued PT services  PT Problem List Decreased strength;Decreased activity tolerance;Decreased balance;Decreased mobility;Decreased coordination;Decreased safety awareness;Pain       PT Treatment Interventions DME  instruction;Gait training;Stair training;Functional mobility training;Therapeutic activities;Therapeutic exercise;Balance training;Neuromuscular re-education;Patient/family education;Manual techniques    PT Goals (Current goals can be found in the Care Plan section)  Acute Rehab PT Goals Patient Stated Goal: To get strong again PT Goal Formulation: With patient/family Time For Goal Achievement: 06/24/17 Potential to Achieve Goals: Good    Frequency Min 2X/week   Barriers to discharge        Co-evaluation               AM-PAC PT "6 Clicks" Daily Activity  Outcome Measure Difficulty turning over in bed (including adjusting bedclothes, sheets and blankets)?: Total Difficulty moving from lying on back to sitting on the side of the bed? : Total Difficulty sitting down on and standing up from a chair with arms (e.g., wheelchair, bedside commode, etc,.)?: Total Help needed moving to and from a bed to chair (including a wheelchair)?: A Little Help needed walking in hospital room?: A Little Help needed climbing 3-5 steps with a railing? : A Lot 6 Click Score: 11    End of Session Equipment Utilized During Treatment: Gait belt Activity Tolerance: Patient tolerated treatment well Patient left: in chair;with call bell/phone within reach;with chair alarm set;with family/visitor present Nurse Communication: Other (comment) PT Visit Diagnosis: Unsteadiness on feet (R26.81);Other abnormalities of gait and mobility (R26.89);Muscle weakness (generalized) (M62.81);Pain    Time: 5681-2751 PT Time Calculation (min) (ACUTE  ONLY): 38 min   Charges:         PT G Codes:        Oran Rein PT, SPT  Bevelyn Ngo 06/10/2017, 3:34 PM

## 2017-06-10 NOTE — Consult Note (Signed)
Referring Physician:  No referring provider defined for this encounter.  Primary Physician:  Rusty Aus, MD  Chief Complaint:  ED consult for hip pain  History of Present Illness: Logan Conner is a 83 y.o. male who is familiar to neurosurgery and had lumbar surgery 6 days ago on 05/05/2017 and discharged the following day. Surgery was performed without complication. Patient states he has done well since surgery, however; last night he reached over to lift the toilet seat up and felt a sudden pain in his right hip. Afterwards he was unable to bear weight on his right leg due to pain and went to ER. He feels that the pain is currently 4/10. Pain has become intermittent and has improved with medication and laying down. Has not attempted to ambulate since being in ER.  Denies back pain, bladder/bowel dysfunction, leg pain/numbness/tingling.  Review of Systems:  A 10 point review of systems is negative, except for the pertinent positives and negatives detailed in the HPI.  Past Medical History: Past Medical History:  Diagnosis Date  . Arthritis   . Glaucoma   . Hyperlipidemia   . Hypertension   . Hyperthyroidism     Past Surgical History: Past Surgical History:  Procedure Laterality Date  . CATARACT EXTRACTION Right   . kidney stone    . LUMBAR LAMINECTOMY/DECOMPRESSION MICRODISCECTOMY Right 06/04/2017   Procedure: LUMBAR LAMINECTOMY/DECOMPRESSION MICRODISCECTOMY 2 LEVELS L4-S1;  Surgeon: Meade Maw, MD;  Location: [facility] ORS;  Service: Neurosurgery;  Laterality: Right;  . TOTAL HIP ARTHROPLASTY Right     Allergies: Allergies as of 06/10/2017  . (No Known Allergies)    Medications:  Current Facility-Administered Medications:  .  0.9 %  sodium chloride infusion, 250 mL, Intravenous, PRN, Sudini, Srikar, MD .  acetaminophen (TYLENOL) tablet 650 mg, 650 mg, Oral, Q6H PRN **OR** acetaminophen (TYLENOL) suppository 650 mg, 650 mg, Rectal, Q6H PRN, Sudini, Srikar, MD .   albuterol (PROVENTIL) (2.5 MG/3ML) 0.083% nebulizer solution 2.5 mg, 2.5 mg, Nebulization, Q2H PRN, Sudini, Srikar, MD .  bisacodyl (DULCOLAX) suppository 10 mg, 10 mg, Rectal, Daily PRN, Sudini, Srikar, MD .  morphine 2 MG/ML injection, , , ,  .  ondansetron (ZOFRAN) tablet 4 mg, 4 mg, Oral, Q6H PRN **OR** ondansetron (ZOFRAN) injection 4 mg, 4 mg, Intravenous, Q6H PRN, Sudini, Srikar, MD .  oxyCODONE (Oxy IR/ROXICODONE) immediate release tablet 5 mg, 5 mg, Oral, Q4H PRN, Sudini, Srikar, MD .  polyethylene glycol (MIRALAX / GLYCOLAX) packet 17 g, 17 g, Oral, Daily PRN, Sudini, Srikar, MD .  sodium chloride flush (NS) 0.9 % injection 3 mL, 3 mL, Intravenous, Q12H, Sudini, Srikar, MD .  sodium chloride flush (NS) 0.9 % injection 3 mL, 3 mL, Intravenous, PRN, Sudini, Srikar, MD  Current Outpatient Prescriptions:  .  acetaminophen (TYLENOL) 650 MG CR tablet, Take 1,300 mg by mouth every 8 (eight) hours as needed for pain., Disp: , Rfl:  .  bimatoprost (LUMIGAN) 0.01 % SOLN, Place 1 drop into both eyes at bedtime. , Disp: , Rfl:  .  brimonidine (ALPHAGAN P) 0.1 % SOLN, Apply 1 drop to eye 2 (two) times daily. , Disp: , Rfl:  .  budesonide-formoterol (SYMBICORT) 160-4.5 MCG/ACT inhaler, Inhale 2 puffs into the lungs 2 (two) times daily., Disp: , Rfl:  .  celecoxib (CELEBREX) 200 MG capsule, Take 1 capsule (200 mg total) by mouth every 12 (twelve) hours., Disp: 60 capsule, Rfl: 0 .  cholecalciferol (VITAMIN D) 1000 units tablet, Take 1,000 Units by  mouth daily., Disp: , Rfl:  .  hydroxychloroquine (PLAQUENIL) 200 MG tablet, TAKE 1 TABLET BY MOUTH ONCE DAILY., Disp: , Rfl:  .  levothyroxine (SYNTHROID) 50 MCG tablet, TAKE 1 TABLET BY MOUTH EVERY MORNING ON AN EMPTY STOMACH. AT LEAST 30-60 MINUTESBEFORE BREAKFAST, WITH A GLASS OF WATER, Disp: , Rfl:  .  methocarbamol (ROBAXIN) 500 MG tablet, Take 1 tablet (500 mg total) by mouth every 6 (six) hours as needed for muscle spasms., Disp: 90 tablet, Rfl: 0 .   methylPREDNISolone (MEDROL DOSEPAK) 4 MG TBPK tablet, Follow package directions, Disp: 21 tablet, Rfl: 0 .  metoprolol succinate (TOPROL-XL) 50 MG 24 hr tablet, TAKE ONE TABLET BY MOUTH ONCE DAILY, Disp: , Rfl:  .  montelukast (SINGULAIR) 10 MG tablet, Take 10 mg by mouth at bedtime. , Disp: , Rfl:  .  Multiple Vitamin (MULTIVITAMIN WITH MINERALS) TABS tablet, Take 1 tablet by mouth daily., Disp: , Rfl:  .  olmesartan (BENICAR) 40 MG tablet, TAKE 1 TABLET BY MOUTH ONCE DAILY., Disp: , Rfl:  .  omeprazole (PRILOSEC) 10 MG capsule, Take 10 mg by mouth daily as needed (indigestion). , Disp: , Rfl:  .  oxyCODONE (ROXICODONE) 5 MG immediate release tablet, Take 1 tablet (5 mg total) by mouth every 4 (four) hours as needed for severe pain or breakthrough pain., Disp: 30 tablet, Rfl: 0 .  simvastatin (ZOCOR) 40 MG tablet, TAKE 1 TABLET BY MOUTH EVERY NIGHT., Disp: , Rfl:  .  triamterene-hydrochlorothiazide (MAXZIDE) 75-50 MG tablet, TAKE 0.5 TABLET BY MOUTH ONCE DAILY., Disp: , Rfl:  .  diclofenac sodium (VOLTAREN) 1 % GEL, Apply 2 g topically 2 (two) times daily., Disp: , Rfl:  .  mupirocin ointment (BACTROBAN) 2 %, Place 1 application into the nose 2 (two) times daily. (Patient not taking: Reported on 06/10/2017), Disp: 22 g, Rfl: 0   Social History: Social History  Substance Use Topics  . Smoking status: Former Smoker    Quit date: 08/16/2004  . Smokeless tobacco: Never Used  . Alcohol use No    Family Medical History: Family History  Problem Relation Age of Onset  . Prostate cancer Neg Hx     Physical Examination: Vitals:   06/10/17 0812 06/10/17 0830  BP: (!) 182/80 (!) 175/105  Pulse: 72 82  Resp:  18  Temp:       General: Patient is well developed, well nourished, calm, collected, and in no apparent distress.  Psychiatric: Patient is non-anxious.  Head:  Pupils equal, round, and reactive to light.  Respiratory: Patient is breathing without any difficulty.  Extremities: No  edema.  Skin:   On exposed skin, there are no abnormal skin lesions.  NEUROLOGICAL:  General: In no acute distress.   Awake, alert, oriented to person, place, and time.  Pupils equal round and reactive to light.  Facial tone is symmetric.    ROM of spine: assessed while laying in bed. Able to sit up with minimal difficulty.   Palpation of spine: nontender.  Incision site healing well. No redness, induration, or swelling. C/D/I   Strength: Side Biceps Triceps Deltoid Interossei Grip Wrist Ext. Wrist Flex.  R 5 5 5 5 5 5 5   L 5 5 5 5 5 5 5    Side Iliopsoas Quads Hamstring PF DF EHL  R 5 5 5 5 5 5   L 5 5 5 5 5 5    Reflexes are 2+ and symmetric at the biceps, triceps, brachioradialis, patella and achilles.  Bilateral upper and lower extremity sensation is intact to light touch and pin prick.  Clonus is not present.  Toes are down-going.  Gait: unable to assess  Imaging: EXAM: MRI LUMBAR SPINE WITHOUT CONTRAST  TECHNIQUE: Multiplanar, multisequence MR imaging of the lumbar spine was performed. No intravenous contrast was administered.  COMPARISON:  03/07/2017 lumbar spine MRI.  FINDINGS: Segmentation:  Standard.  Alignment:  Grade 1 L5-S1 anterolisthesis is stable.  Vertebrae: Mild L2-3 degenerative endplate edema. Interval postsurgical changes related to right-sided L4-5 and L5-S1 laminectomy. Edema within paraspinal muscles and subcutaneous fat extending to the laminectomy beds.  Conus medullaris: Extends to the L1 Level and appears normal.  Paraspinal and other soft tissues: Mild left hydronephrosis. 28 mm mass within the mid left kidney.  Disc levels:  L1-2: Stable disc bulge with facet and ligamentum flavum hypertrophy. Stable mild bilateral foraminal stenosis and mild canal stenosis.  L2-3: Stable moderate disc bulge, marginal endplate osteophytes, with facet and ligamentum flavum hypertrophy. Stable moderate bilateral foraminal stenosis and mild  canal stenosis.  L3-4: Stable small disc bulge, endplate marginal osteophytes, moderate facet, and moderate ligamentum flavum hypertrophy. Stable moderate bilateral foraminal stenosis and moderate canal stenosis.  L4-5: Interval right-sided laminectomy and ligamentum flavum resection. Persistent disc and facet disease results in moderate bilateral foraminal stenosis and moderate stenosis, although improved from the prior study.  L5-S1: Interval right-sided laminectomy. Persistent right greater than left disc and facet disease with moderate right and mild left foraminal stenosis. Mild canal stenosis.  IMPRESSION: Motion degraded study.  1. No acute osseous abnormality identified. Stable grade 1 L5-S1 anterolisthesis. 2. Interval right L4-5 and L5-S1 laminectomy with edema in the paraspinal muscles and laminectomy bed. 3. Improved patency of L4-5 spinal canal post laminectomy with moderate persistent L3-4 and L4-5 canal stenosis. 4. Stable multilevel mild to moderate foraminal stenosis. 5. Mild left hydronephrosis of uncertain significance. 6. 28 mm mass within the mid left kidney again seen.   Electronically Signed   By: Kristine Garbe M.D.   On: 06/10/2017 06:19  EXAM: MRI LUMBAR SPINE WITHOUT CONTRAST  TECHNIQUE: Multiplanar, multisequence MR imaging of the lumbar spine was performed. No intravenous contrast was administered.  COMPARISON:  None.  FINDINGS: Segmentation: 5 non rib-bearing lumbar type vertebral bodies are present.  Alignment: Slight retrolisthesis is present at L1-2, L2-3, and L3-4.  Vertebrae: Chronic endplate marrow changes are present throughout the lumbar spine. There is fatty infiltration of the marrow space is well, particularly in iliac bones.  Conus medullaris: Extends to the T12-L1 level and appears normal.  Paraspinal and other soft tissues: Limited imaging of the abdomen demonstrates a focal mass lesion in the  medial aspect of the left kidney measuring 2.4 x 2.9 cm. A 2.4 cm cyst is present at the lower pole of the right kidney. No other focal lesions are present. There is no significant adenopathy.  Disc levels:  T12-L1: Mild disc bulging facet hypertrophy is present without significant stenosis.  L1-2: Lateral disc protrusions are noted. Facet hypertrophy is noted bilaterally. Mild subarticular narrowing is evident bilaterally. Right greater than left mild foraminal narrowing is present. The central canal is patent.  L2-3: A broad-based disc protrusion is present. Moderate facet hypertrophy is noted bilaterally. Moderate left mild right subarticular narrowing is present. Moderate foraminal stenosis is worse on the left.  L3-4: A broad-based disc protrusion is present. Moderate facet hypertrophy is noted bilaterally. This results in moderate subarticular and foraminal narrowing bilaterally.  L4-5: A broad-based disc protrusion is  present. Moderate facet hypertrophy and ligamentum flavum thickening results in severe central canal stenosis. Moderate foraminal narrowing is evident bilaterally.  L5-S1: A broad-based disc protrusion is present. Moderate facet hypertrophy is noted bilaterally. Spurring results in moderate subarticular narrowing bilaterally. Mild foraminal narrowing is present bilaterally.  IMPRESSION: 1. The most significant disease is at L4-5 with severe central and moderate bilateral foraminal narrowing. 2. Moderate subarticular and mild foraminal narrowing bilaterally at L5-S1. 3. Moderate subarticular and foraminal stenosis bilaterally at L3-4. 4. Moderate left and mild right subarticular and moderate bilateral foraminal narrowing at L2-3. 5. Right greater than left mild foraminal narrowing at L1-2   Electronically Signed   By: San Morelle M.D.   On: 03/07/2017 14:35    DG HIP (WITH OR WITHOUT PELVIS) 2-3V RIGHT  COMPARISON:   None.  FINDINGS: Postoperative right total hip arthroplasty using cemented femoral component and a single screw in the acetabular component. Components appear well seated. No acute dislocation. There is cortical irregularity of the lesser trochanter which appears to be acute in could indicate an avulsion fracture. Diffuse bone demineralization. Degenerative changes in the lower lumbar spine and left hip. Pelvis appears intact. SI joints and symphysis pubis are not displaced. Vascular calcifications. Calcified phleboliths in the pelvis.  IMPRESSION: Right total hip arthroplasty. Components appear well seated. Possible acute avulsion of the lesser trochanter.   Electronically Signed   By: Lucienne Capers M.D.   On: 06/10/2017 02:13  I have personally reviewed the images and agree with the above interpretation.  Assessment and Plan: Patient is POD6 after lumbar decompression with Dr. Izora Ribas. Patient tolerated procedure well, without complications, and was discharged the following day. Since discharge, patient states symptoms prior to surgery have significantly improved. Lumbar MRI imaging was reviewed with Dr. Aris Lot and unremarkable for new stenosis, nerve compression, or herniation. Physical exam was also reassuring as strength and sensation were intact. Dr. Izora Ribas was made aware of patient condition and will see him in the morning.    Marin Olp, PA-C Dept. of Neurosurgery

## 2017-06-10 NOTE — NC FL2 (Signed)
[place] LEVEL OF CARE SCREENING TOOL     IDENTIFICATION  Patient Name: Logan Conner Birthdate: Sep 22, 1925 Sex: male Admission Date (Current Location): 06/10/2017  [place] and [place] Number:  Engineering geologist and Address:  [facility], [address] [place], [place] [address]      Provider Number: [id number]  Attending Physician Name and Address:  Hillary Bow, MD  Relative Name and Phone Number:       Current Level of Care: Hospital Recommended Level of Care: [place] Prior Approval Number:    Date Approved/Denied:   PASRR Number:  [id number] )  Discharge Plan: SNF    Current Diagnoses: Patient Active Problem List   Diagnosis Date Noted  . Right hip pain 06/10/2017  . Neurogenic claudication due to lumbar spinal stenosis 06/04/2017    Orientation RESPIRATION BLADDER Height & Weight     Self, Situation, Time, Place  Normal Incontinent Weight: 161 lb (73 kg) Height:  5\' 10"  (177.8 cm)  BEHAVIORAL SYMPTOMS/MOOD NEUROLOGICAL BOWEL NUTRITION STATUS   (none)  (none) Continent Diet (Diet: 2 Gram Sodium. )  AMBULATORY STATUS COMMUNICATION OF NEEDS Skin   Extensive Assist Verbally Surgical wounds (06/04/17: Back Incision. )                       Personal Care Assistance Level of Assistance  Bathing, Feeding, Dressing Bathing Assistance: Limited assistance Feeding assistance: Independent Dressing Assistance: Limited assistance     Functional Limitations Info  Sight, Hearing, Speech Sight Info: Adequate Hearing Info: Adequate Speech Info: Adequate    SPECIAL CARE FACTORS FREQUENCY  PT (By licensed PT), OT (By licensed OT)     PT Frequency:  (5) OT Frequency:  (5)            Contractures      Additional Factors Info  Code Status, Allergies, Isolation Precautions Code Status Info:  (Full Code. ) Allergies Info:  (No Known Allergies. )     Isolation Precautions Info:  (MRSA  Nasal Swab. )     Current Medications (06/10/2017):  This is the current hospital active medication list Current Facility-Administered Medications  Medication Dose Route Frequency Provider Last Rate Last Dose  . 0.9 %  sodium chloride infusion  250 mL Intravenous PRN Sudini, Alveta Heimlich, MD      . acetaminophen (TYLENOL) tablet 650 mg  650 mg Oral Q6H PRN Hillary Bow, MD       Or  . acetaminophen (TYLENOL) suppository 650 mg  650 mg Rectal Q6H PRN Sudini, Srikar, MD      . albuterol (PROVENTIL) (2.5 MG/3ML) 0.083% nebulizer solution 2.5 mg  2.5 mg Nebulization Q2H PRN Sudini, Alveta Heimlich, MD      . bisacodyl (DULCOLAX) suppository 10 mg  10 mg Rectal Daily PRN Sudini, Alveta Heimlich, MD      . brimonidine (ALPHAGAN) 0.15 % ophthalmic solution 1 drop  1 drop Both Eyes BID Hillary Bow, MD   1 drop at 06/10/17 1522  . celecoxib (CELEBREX) capsule 200 mg  200 mg Oral Q12H Sudini, Srikar, MD   200 mg at 06/10/17 1519  . hydroxychloroquine (PLAQUENIL) tablet 200 mg  200 mg Oral Daily Hillary Bow, MD   200 mg at 06/10/17 1519  . irbesartan (AVAPRO) tablet 75 mg  75 mg Oral Daily Hillary Bow, MD   75 mg at 06/10/17 1519  . ketorolac (TORADOL) 30 MG/ML injection 30 mg  30 mg Intravenous Once Sudini, [employer],  MD      . latanoprost (XALATAN) 0.005 % ophthalmic solution 1 drop  1 drop Both Eyes QHS Sudini, Alveta Heimlich, MD      . Derrill Memo ON 06/11/2017] levothyroxine (SYNTHROID, LEVOTHROID) tablet 50 mcg  50 mcg Oral QAC breakfast Sudini, Srikar, MD      . methocarbamol (ROBAXIN) tablet 500 mg  500 mg Oral Q6H PRN Sudini, Alveta Heimlich, MD      . metoprolol succinate (TOPROL-XL) 24 hr tablet 50 mg  50 mg Oral Daily Hillary Bow, MD   50 mg at 06/10/17 1519  . mometasone-formoterol (DULERA) 200-5 MCG/ACT inhaler 2 puff  2 puff Inhalation BID Sudini, Srikar, MD      . montelukast (SINGULAIR) tablet 10 mg  10 mg Oral QHS Sudini, Srikar, MD      . ondansetron (ZOFRAN) tablet 4 mg  4 mg Oral Q6H PRN Hillary Bow, MD       Or  .  ondansetron (ZOFRAN) injection 4 mg  4 mg Intravenous Q6H PRN Sudini, Srikar, MD      . oxyCODONE (Oxy IR/ROXICODONE) immediate release tablet 5 mg  5 mg Oral Q4H PRN Sudini, Srikar, MD      . pantoprazole (PROTONIX) EC tablet 40 mg  40 mg Oral Daily Hillary Bow, MD   40 mg at 06/10/17 1519  . polyethylene glycol (MIRALAX / GLYCOLAX) packet 17 g  17 g Oral Daily PRN Sudini, Alveta Heimlich, MD      . simvastatin (ZOCOR) tablet 40 mg  40 mg Oral QHS Sudini, Srikar, MD      . sodium chloride flush (NS) 0.9 % injection 3 mL  3 mL Intravenous Q12H Hillary Bow, MD   3 mL at 06/10/17 1518  . sodium chloride flush (NS) 0.9 % injection 3 mL  3 mL Intravenous PRN Hillary Bow, MD         Discharge Medications: Please see discharge summary for a list of discharge medications.  Relevant Imaging Results:  Relevant Lab Results:   Additional Information  (SSN: [id number])  Nyxon Strupp, Veronia Beets, LCSW

## 2017-06-10 NOTE — Clinical Social Work Note (Signed)
Clinical Social Work Assessment  Patient Details  Name: Logan Conner MRN: [id number] Date of Birth: Jan 09, 1925  Date of referral:  06/10/17               Reason for consult:  Facility Placement                Permission sought to share information with:  Chartered certified accountant granted to share information::  Yes, Verbal Permission Granted  Name::      Logan Conner::   [name]   Relationship::     Contact Information:     Housing/Transportation Living arrangements for the past 2 months:  [place] of Information:  Patient, Adult Children Patient Interpreter Needed:  None Criminal Activity/Legal Involvement Pertinent to Current Situation/Hospitalization:  No - Comment as needed Significant Relationships:  Adult Children, Spouse Lives with:  Spouse Do you feel safe going back to the place where you live?  Yes Need for family participation in patient care:  Yes (Comment)  Care giving concerns:  Patient lives in [place] with his wife Logan Conner.    Social Worker assessment / plan:  Holiday representative (CSW) received verbal consult from RN case manager that PT is recommending SNF. CSW met with patient and his daughter Logan Conner from [place] was at bedside. CSW introduced self and explained role of CSW department. Patient was alert and oriented X4 and was sitting up in the chair at bedside. Patient reported that he lives in [place] with his wife Logan Conner and has 2 adult daughters. CSW explained SNF process. Patient is agreeable to SNF search and prefers [facility] FL2 complete and faxed out.   CSW presented bed offers to patient and he chose [facility] admissions coordinator at [facility] is aware of accepted bed offer. CSW will continue to follow and assist as needed.    Employment status:  Retired Nurse, adult PT Recommendations:  [place] / Referral to  community resources:  [place]  Patient/Family's Response to care:  Patient is agreeable to going to [facility]   Patient/Family's Understanding of and Emotional Response to Diagnosis, Current Treatment, and Prognosis:  Patient was very pleasant and thanked CSW for assistance.   Emotional Assessment Appearance:  Appears stated age Attitude/Demeanor/Rapport:    Affect (typically observed):  Accepting, Adaptable, Pleasant Orientation:  Oriented to Self, Oriented to Place, Oriented to  Time, Oriented to Situation Alcohol / Substance use:  Not Applicable Psych involvement (Current and /or in the community):  No (Comment)  Discharge Needs  Concerns to be addressed:  Discharge Planning Concerns Readmission within the last 30 days:  No Current discharge risk:  Dependent with Mobility Barriers to Discharge:  Continued Medical Work up   [employer], Veronia Beets, LCSW 06/10/2017, 4:13 PM

## 2017-06-10 NOTE — ED Notes (Signed)
Received report from [facility], Care assumed. Pt resting in bed at this time.

## 2017-06-10 NOTE — ED Triage Notes (Signed)
Pt came in via [facility] from home.  Pt was leaning to pick up toilet seat and felt hip pop and started to have pain.  Pt has a previous R hip replacement.  Pt guarding R hip and unable to put any pressure on R hip.  Pt A&Ox4 at this time.

## 2017-06-10 NOTE — ED Notes (Signed)
Pt called out due to bp increase to 199/91. Family concerned with monitor alarm. Pt resting comfortably upon this nurse's entrance into room. Pt due for bp medication this morning.

## 2017-06-10 NOTE — ED Provider Notes (Signed)
[facility] Emergency Department Provider Note    First MD Initiated Contact with Patient 06/10/17 0130     (approximate)  I have reviewed the triage vital signs and the nursing notes.   HISTORY  Chief Complaint Hip Pain   HPI Logan Conner is a 83 y.o. male with below list of chronic medical conditions status post lumbar decompression L4-S1 on 06/04/2017 presents to the emergency department with nontraumatic right hip pain. Patient states that he was attempting to pick up the toilet seat. Patient admits to 7 out of 10 right hip pain. Of note patient states that he took a oxycodone before presenting to the emergency department without relief.   Past Medical History:  Diagnosis Date  . Arthritis   . Glaucoma   . Hyperlipidemia   . Hypertension   . Hyperthyroidism     Patient Active Problem List   Diagnosis Date Noted  . Neurogenic claudication due to lumbar spinal stenosis 06/04/2017    Past Surgical History:  Procedure Laterality Date  . CATARACT EXTRACTION Right   . kidney stone    . LUMBAR LAMINECTOMY/DECOMPRESSION MICRODISCECTOMY Right 06/04/2017   Procedure: LUMBAR LAMINECTOMY/DECOMPRESSION MICRODISCECTOMY 2 LEVELS L4-S1;  Surgeon: Meade Maw, MD;  Location: [facility] ORS;  Service: Neurosurgery;  Laterality: Right;  . TOTAL HIP ARTHROPLASTY Right     Prior to Admission medications   Medication Sig Start Date End Date Taking? Authorizing Provider  acetaminophen (TYLENOL) 650 MG CR tablet Take 1,300 mg by mouth every 8 (eight) hours as needed for pain.    [provider]  bimatoprost (LUMIGAN) 0.01 % SOLN Place 1 drop into both eyes at bedtime.  04/21/14   [provider]  brimonidine (ALPHAGAN P) 0.1 % SOLN Apply 1 drop to eye 2 (two) times daily.     [provider]  budesonide-formoterol (SYMBICORT) 160-4.5 MCG/ACT inhaler Inhale 2 puffs into the lungs 2 (two) times daily.    [provider]    celecoxib (CELEBREX) 200 MG capsule Take 1 capsule (200 mg total) by mouth every 12 (twelve) hours. 06/05/17   Marin Olp, PA-C  cholecalciferol (VITAMIN D) 1000 units tablet Take 1,000 Units by mouth daily.    [provider]  diclofenac sodium (VOLTAREN) 1 % GEL Apply 2 g topically 2 (two) times daily. 08/31/16 08/31/17  [provider]  hydroxychloroquine (PLAQUENIL) 200 MG tablet TAKE 1 TABLET BY MOUTH ONCE DAILY. 02/26/16   [provider]  levothyroxine (SYNTHROID) 50 MCG tablet TAKE 1 TABLET BY MOUTH EVERY MORNING ON AN EMPTY STOMACH. AT LEAST 30-60 MINUTESBEFORE BREAKFAST, WITH A GLASS OF WATER 08/12/16   [provider]  methocarbamol (ROBAXIN) 500 MG tablet Take 1 tablet (500 mg total) by mouth every 6 (six) hours as needed for muscle spasms. 06/05/17   Marin Olp, PA-C  methylPREDNISolone (MEDROL DOSEPAK) 4 MG TBPK tablet Follow package directions 06/05/17   Marin Olp, PA-C  metoprolol succinate (TOPROL-XL) 50 MG 24 hr tablet TAKE ONE TABLET BY MOUTH ONCE DAILY 04/30/16   [provider]  montelukast (SINGULAIR) 10 MG tablet Take 10 mg by mouth at bedtime.  05/16/16   [provider]  Multiple Vitamin (MULTIVITAMIN WITH MINERALS) TABS tablet Take 1 tablet by mouth daily.    [provider]  mupirocin ointment (BACTROBAN) 2 % Place 1 application into the nose 2 (two) times daily. 06/05/17   Marin Olp, PA-C  olmesartan (BENICAR) 40 MG tablet TAKE 1 TABLET BY MOUTH ONCE DAILY.  01/29/16   [provider]  omeprazole (PRILOSEC) 10 MG capsule Take 10 mg by mouth daily as needed (indigestion).     [provider]  oxyCODONE (ROXICODONE) 5 MG immediate release tablet Take 1 tablet (5 mg total) by mouth every 4 (four) hours as needed for severe pain or breakthrough pain. 06/05/17   Marin Olp, PA-C  simvastatin (ZOCOR) 40 MG tablet TAKE 1 TABLET BY MOUTH EVERY NIGHT. 04/16/16   [provider]   triamterene-hydrochlorothiazide (MAXZIDE) 75-50 MG tablet TAKE 0.5 TABLET BY MOUTH ONCE DAILY. 08/14/15   [provider]    Allergies No Known Drug Allergies  Family History  Problem Relation Age of Onset  . Prostate cancer Neg Hx     Social History Social History  Substance Use Topics  . Smoking status: Former Smoker    Quit date: 08/16/2004  . Smokeless tobacco: Never Used  . Alcohol use No    Review of Systems Constitutional: No fever/chills Eyes: No visual changes. ENT: No sore throat. Cardiovascular: Denies chest pain. Respiratory: Denies shortness of breath. Gastrointestinal: No abdominal pain.  No nausea, no vomiting.  No diarrhea.  No constipation. Genitourinary: Negative for dysuria. Musculoskeletal: Negative for neck pain.  Negative for back pain. Positive for right hip pain Integumentary: Negative for rash. Neurological: Negative for headaches, focal weakness or numbness.   ____________________________________________   PHYSICAL EXAM:  VITAL SIGNS: ED Triage Vitals  Enc Vitals Group     BP 06/10/17 0139 (!) 188/93     Pulse Rate 06/10/17 0139 86     Resp 06/10/17 0139 18     Temp 06/10/17 0139 98.1 F (36.7 C)     Temp Source 06/10/17 0139 Oral     SpO2 06/10/17 0139 96 %     Weight 06/10/17 0140 73 kg (161 lb)     Height 06/10/17 0140 1.778 m (5\' 10" )     Head Circumference --      Peak Flow --      Pain Score 06/10/17 0139 10     Pain Loc --      Pain Edu? --      Excl. in [place]? --     Constitutional: Alert and oriented. Apparent discomfort Eyes: Conjunctivae are normal.  Head: Atraumatic. Mouth/Throat: Mucous membranes are moist.  Oropharynx non-erythematous. Neck: No stridor.  Cardiovascular: Normal rate, regular rhythm. Good peripheral circulation. Grossly normal heart sounds. Respiratory: Normal respiratory effort.  No retractions. Lungs CTAB. Gastrointestinal: Soft and nontender. No distention.  Musculoskeletal: No lower  extremity tenderness nor edema. No gross deformities of extremities. Right hip pain with palpation, pain with active and passive range of motion. No gross abnormality noted on the lumbar spine Neurologic:  Normal speech and language. No gross focal neurologic deficits are appreciated.  Skin:  Skin is warm, dry and intact. No rash noted. Psychiatric: Mood and affect are normal. Speech and behavior are normal.  ____________________________________________   LABS (all labs ordered are listed, but only abnormal results are displayed)  Labs Reviewed  BASIC METABOLIC PANEL - Abnormal; Notable for the following:       Result Value   Glucose, Bld 150 (*)    BUN 27 (*)    All other components within normal limits  CBC - Abnormal; Notable for the following:    WBC 12.1 (*)    RBC 4.36 (*)    All other components within normal limits     RADIOLOGY I, Dresser N Kinzly Pierrelouis, personally viewed and  evaluated these images (plain radiographs) as part of my medical decision making, as well as reviewing the written report by the radiologist.  Ct Hip Right Wo Contrast  Result Date: 06/10/2017 CLINICAL DATA:  83 year old male with right hip arthroplasty and pain. EXAM: CT OF THE RIGHT HIP WITHOUT CONTRAST TECHNIQUE: Multidetector CT imaging of the right hip was performed according to the standard protocol. Multiplanar CT image reconstructions were also generated. COMPARISON:  Radiograph dated 06/10/2017 and CT dated 03/24/2017 FINDINGS: Bones/Joint/Cartilage There is a total right hip arthroplasty which appears intact and in anatomic alignment. There is limited evaluation of the bones due to osteopenia and streak artifact caused by arthroplasty. There is no acute fracture or dislocation. Linear corticated density adjacent to the lesser trochanter of the right femur is chronic and was seen on the study of 03/24/2017 and CT of 03/31/2009. Ligaments Suboptimally assessed by CT. Muscles and Tendons No acute findings.   No intramuscular hematoma. Soft tissues There is atherosclerotic calcification of the visualized vasculature. The soft tissues appear unremarkable. IMPRESSION: 1. Right hip arthroplasty appears intact. 2. No acute fracture or dislocation. Electronically Signed   By: Anner Crete M.D.   On: 06/10/2017 03:20   Dg Hip Unilat W Or Wo Pelvis 2-3 Views Right  Result Date: 06/10/2017 CLINICAL DATA:  Patient felt a pop with subsequent right hip pain after leaning over to pick up the toilet seat. EXAM: DG HIP (WITH OR WITHOUT PELVIS) 2-3V RIGHT COMPARISON:  None. FINDINGS: Postoperative right total hip arthroplasty using cemented femoral component and a single screw in the acetabular component. Components appear well seated. No acute dislocation. There is cortical irregularity of the lesser trochanter which appears to be acute in could indicate an avulsion fracture. Diffuse bone demineralization. Degenerative changes in the lower lumbar spine and left hip. Pelvis appears intact. SI joints and symphysis pubis are not displaced. Vascular calcifications. Calcified phleboliths in the pelvis. IMPRESSION: Right total hip arthroplasty. Components appear well seated. Possible acute avulsion of the lesser trochanter. Electronically Signed   By: Lucienne Capers M.D.   On: 06/10/2017 02:13      Procedures   ____________________________________________   INITIAL IMPRESSION / ASSESSMENT AND PLAN / ED COURSE  Pertinent labs & imaging results that were available during my care of the patient were reviewed by me and considered in my medical decision making (see chart for details).  83 year old male presenting to the emergency department with nontraumatic right hip pain. Patient received multiple doses of IV morphine in the emergency department without improvement of pain imaging modalities performed including x-ray of the right hip CT scan of the right hip and MRI of the lumbar and right hip revealed no clear  etiology for the patient's discomfort. However patient is unable to ambulate at this time and a such patient discussed with Dr. Hedda Slade admission for further evaluation and management.      ____________________________________________  FINAL CLINICAL IMPRESSION(S) / ED DIAGNOSES  Right hip pain Lumbar spine pain  MEDICATIONS GIVEN DURING THIS VISIT:  Medications  morphine 2 MG/ML injection (not administered)  ondansetron (ZOFRAN) injection 4 mg (4 mg Intravenous Given 06/10/17 0146)  morphine 2 MG/ML injection 2 mg (2 mg Intravenous Given 06/10/17 0146)  morphine 2 MG/ML injection 2 mg (2 mg Intravenous Given 06/10/17 0233)  morphine 2 MG/ML injection 2 mg (2 mg Intravenous Given 06/10/17 0337)     NEW OUTPATIENT MEDICATIONS STARTED DURING THIS VISIT:  New Prescriptions   No medications on file  Modified Medications   No medications on file    Discontinued Medications   No medications on file     Note:  This document was prepared using Dragon voice recognition software and may include unintentional dictation errors.    Gregor Hams, MD 06/11/17 [phone]

## 2017-06-10 NOTE — Care Management (Signed)
Patient open to [facility] for PT.

## 2017-06-10 NOTE — ED Notes (Signed)
Pt to MRI

## 2017-06-10 NOTE — Clinical Social Work Placement (Signed)
   CLINICAL SOCIAL WORK PLACEMENT  NOTE  Date:  06/10/2017  Patient Details  Name: Logan Conner MRN: [id number] Date of Birth: 29-Dec-1924  Clinical Social Work is seeking post-discharge placement for this patient at the [place] level of care (*CSW will initial, date and re-position this form in  chart as items are completed):  Yes   Patient/family provided with [facility] Department's list of facilities offering this level of care within the geographic area requested by the patient (or if unable, by the patient's family).  Yes   Patient/family informed of their freedom to choose among providers that offer the needed level of care, that participate in Medicare, Medicaid or managed care program needed by the patient, have an available bed and are willing to accept the patient.  Yes   Patient/family informed of [facility]'s ownership interest in [facility] and [facility], as well as of the fact that they are under no obligation to receive care at these facilities.  PASRR submitted to EDS on       PASRR number received on       Existing PASRR number confirmed on 06/10/17     FL2 transmitted to all facilities in geographic area requested by pt/family on 06/10/17     FL2 transmitted to all facilities within larger geographic area on       Patient informed that his/her managed care company has contracts with or will negotiate with certain facilities, including the following:        Yes   Patient/family informed of bed offers received.  Patient chooses bed at  [facility] )     Physician recommends and patient chooses bed at      Patient to be transferred to   on  .  Patient to be transferred to facility by       Patient family notified on   of transfer.  Name of family member notified:        PHYSICIAN       Additional Comment:    _______________________________________________ Eliott Amparan, Veronia Beets, LCSW 06/10/2017,  4:12 PM

## 2017-06-10 NOTE — ED Notes (Signed)
Pt returned from MRI. C/O increase in pain. MD aware.

## 2017-06-10 NOTE — H&P (Signed)
[facility] NAME: Logan Conner    MR#:  [id number]  DATE OF BIRTH:  1925-03-16  DATE OF ADMISSION:  06/10/2017  PRIMARY CARE PHYSICIAN: Rusty Aus, MD   REQUESTING/REFERRING PHYSICIAN: Dr. Owens Shark  CHIEF COMPLAINT:   Chief Complaint  Patient presents with  . Hip Pain    HISTORY OF PRESENT ILLNESS:  Logan Conner  is a 83 y.o. male with a known history of Hypertension, spinal stenosis with recent surgery, based on ambulation with a walker presents to the emergency room with acute pain in the right hip after he bent down to lift the toilet seat. Patient was unable to put any weight on his right leg. Presented to the emergency room. Here he received 3 doses of morphine and still has inadequate control of pain. MRI of the hip and lumbar spine shows nothing acute. It shows postop changes. CT scan of the right hip was done which shows intact right hip prosthesis. Afebrile. Being admitted for pain control.  PAST MEDICAL HISTORY:   Past Medical History:  Diagnosis Date  . Arthritis   . Glaucoma   . Hyperlipidemia   . Hypertension   . Hyperthyroidism     PAST SURGICAL HISTORY:   Past Surgical History:  Procedure Laterality Date  . CATARACT EXTRACTION Right   . kidney stone    . LUMBAR LAMINECTOMY/DECOMPRESSION MICRODISCECTOMY Right 06/04/2017   Procedure: LUMBAR LAMINECTOMY/DECOMPRESSION MICRODISCECTOMY 2 LEVELS L4-S1;  Surgeon: Meade Maw, MD;  Location: [facility] ORS;  Service: Neurosurgery;  Laterality: Right;  . TOTAL HIP ARTHROPLASTY Right     SOCIAL HISTORY:   Social History  Substance Use Topics  . Smoking status: Former Smoker    Quit date: 08/16/2004  . Smokeless tobacco: Never Used  . Alcohol use No    FAMILY HISTORY:   Family History  Problem Relation Age of Onset  . Prostate cancer Neg Hx     DRUG ALLERGIES:  No Known Allergies  REVIEW OF SYSTEMS:   Review of Systems  Constitutional:  Negative for chills and fever.  HENT: Negative for sore throat.   Eyes: Negative for blurred vision, double vision and pain.  Respiratory: Negative for cough, hemoptysis, shortness of breath and wheezing.   Cardiovascular: Negative for chest pain, palpitations, orthopnea and leg swelling.  Gastrointestinal: Negative for abdominal pain, constipation, diarrhea, heartburn, nausea and vomiting.  Genitourinary: Negative for dysuria and hematuria.  Musculoskeletal: Positive for back pain and joint pain.  Skin: Negative for rash.  Neurological: Negative for sensory change, speech change, focal weakness and headaches.  Endo/Heme/Allergies: Does not bruise/bleed easily.  Psychiatric/Behavioral: Negative for depression. The patient is not nervous/anxious.     MEDICATIONS AT HOME:   Prior to Admission medications   Medication Sig Start Date End Date Taking? Authorizing Provider  acetaminophen (TYLENOL) 650 MG CR tablet Take 1,300 mg by mouth every 8 (eight) hours as needed for pain.   Yes [provider]  bimatoprost (LUMIGAN) 0.01 % SOLN Place 1 drop into both eyes at bedtime.  04/21/14  Yes [provider]  brimonidine (ALPHAGAN P) 0.1 % SOLN Apply 1 drop to eye 2 (two) times daily.    Yes [provider]  budesonide-formoterol (SYMBICORT) 160-4.5 MCG/ACT inhaler Inhale 2 puffs into the lungs 2 (two) times daily.   Yes [provider]  celecoxib (CELEBREX) 200 MG capsule Take 1 capsule (200 mg total) by mouth every 12 (twelve) hours. 06/05/17  Yes Marin Olp,  PA-C  cholecalciferol (VITAMIN D) 1000 units tablet Take 1,000 Units by mouth daily.   Yes [provider]  hydroxychloroquine (PLAQUENIL) 200 MG tablet TAKE 1 TABLET BY MOUTH ONCE DAILY. 02/26/16  Yes [provider]  levothyroxine (SYNTHROID) 50 MCG tablet TAKE 1 TABLET BY MOUTH EVERY MORNING ON AN EMPTY STOMACH. AT LEAST 30-60 MINUTESBEFORE BREAKFAST, WITH A GLASS OF WATER 08/12/16  Yes  [provider]  methocarbamol (ROBAXIN) 500 MG tablet Take 1 tablet (500 mg total) by mouth every 6 (six) hours as needed for muscle spasms. 06/05/17  Yes Marin Olp, PA-C  methylPREDNISolone (MEDROL DOSEPAK) 4 MG TBPK tablet Follow package directions 06/05/17  Yes Marin Olp, PA-C  metoprolol succinate (TOPROL-XL) 50 MG 24 hr tablet TAKE ONE TABLET BY MOUTH ONCE DAILY 04/30/16  Yes [provider]  montelukast (SINGULAIR) 10 MG tablet Take 10 mg by mouth at bedtime.  05/16/16  Yes [provider]  Multiple Vitamin (MULTIVITAMIN WITH MINERALS) TABS tablet Take 1 tablet by mouth daily.   Yes [provider]  olmesartan (BENICAR) 40 MG tablet TAKE 1 TABLET BY MOUTH ONCE DAILY. 01/29/16  Yes [provider]  omeprazole (PRILOSEC) 10 MG capsule Take 10 mg by mouth daily as needed (indigestion).    Yes [provider]  oxyCODONE (ROXICODONE) 5 MG immediate release tablet Take 1 tablet (5 mg total) by mouth every 4 (four) hours as needed for severe pain or breakthrough pain. 06/05/17  Yes Marin Olp, PA-C  simvastatin (ZOCOR) 40 MG tablet TAKE 1 TABLET BY MOUTH EVERY NIGHT. 04/16/16  Yes [provider]  triamterene-hydrochlorothiazide (MAXZIDE) 75-50 MG tablet TAKE 0.5 TABLET BY MOUTH ONCE DAILY. 08/14/15  Yes [provider]  diclofenac sodium (VOLTAREN) 1 % GEL Apply 2 g topically 2 (two) times daily. 08/31/16 08/31/17  [provider]  mupirocin ointment (BACTROBAN) 2 % Place 1 application into the nose 2 (two) times daily. Patient not taking: Reported on 06/10/2017 06/05/17   Marin Olp, PA-C     VITAL SIGNS:  Blood pressure (!) 175/105, pulse 82, temperature 98.1 F (36.7 C), temperature source Oral, resp. rate 18, height 5\' 10"  (1.778 m), weight 73 kg (161 lb), SpO2 97 %.  PHYSICAL EXAMINATION:  Physical Exam  GENERAL:  83 y.o.-year-old patient lying in the bed with no acute distress. Decreased hearing EYES:  Pupils equal, round, reactive to light and accommodation. No scleral icterus. Extraocular muscles intact.  HEENT: Head atraumatic, normocephalic. Oropharynx and nasopharynx clear. No oropharyngeal erythema, moist oral mucosa  NECK:  Supple, no jugular venous distention. No thyroid enlargement, no tenderness.  LUNGS: Normal breath sounds bilaterally, no wheezing, rales, rhonchi. No use of accessory muscles of respiration.  CARDIOVASCULAR: S1, S2 normal. No murmurs, rubs, or gallops.  ABDOMEN: Soft, nontender, nondistended. Bowel sounds present. No organomegaly or mass.  EXTREMITIES: No pedal edema, cyanosis, or clubbing. + 2 pedal & radial pulses b/l.  Tenderness of the right hip. NEUROLOGIC: Cranial nerves II through XII are intact. No focal Motor or sensory deficits appreciated b/l PSYCHIATRIC: The patient is alert and oriented x 3. Good affect.  SKIN: No obvious rash, lesion, or ulcer.   LABORATORY PANEL:   CBC  Recent Labs Lab 06/10/17 0141  WBC 12.1*  HGB 14.6  HCT 42.9  PLT 204   ------------------------------------------------------------------------------------------------------------------  Chemistries   Recent Labs Lab 06/10/17 0141  NA 141  K 4.6  CL 108  CO2 23  GLUCOSE 150*  BUN 27*  CREATININE 0.99  CALCIUM 9.5   ------------------------------------------------------------------------------------------------------------------  Cardiac Enzymes No results for input(s): TROPONINI in the last 168 hours. ------------------------------------------------------------------------------------------------------------------  RADIOLOGY:  Mr Lumbar Spine Wo Contrast  Result Date: 06/10/2017 CLINICAL DATA:  83 y/o M; lumbar spine pain. Laminectomy decompression 06/04/2017 at the L4 through S1 levels. EXAM: MRI LUMBAR SPINE WITHOUT CONTRAST TECHNIQUE: Multiplanar, multisequence MR imaging of the lumbar spine was performed. No intravenous contrast was administered.  COMPARISON:  03/07/2017 lumbar spine MRI. FINDINGS: Segmentation:  Standard. Alignment:  Grade 1 L5-S1 anterolisthesis is stable. Vertebrae: Mild L2-3 degenerative endplate edema. Interval postsurgical changes related to right-sided L4-5 and L5-S1 laminectomy. Edema within paraspinal muscles and subcutaneous fat extending to the laminectomy beds. Conus medullaris: Extends to the L1 Level and appears normal. Paraspinal and other soft tissues: Mild left hydronephrosis. 28 mm mass within the mid left kidney. Disc levels: L1-2: Stable disc bulge with facet and ligamentum flavum hypertrophy. Stable mild bilateral foraminal stenosis and mild canal stenosis. L2-3: Stable moderate disc bulge, marginal endplate osteophytes, with facet and ligamentum flavum hypertrophy. Stable moderate bilateral foraminal stenosis and mild canal stenosis. L3-4: Stable small disc bulge, endplate marginal osteophytes, moderate facet, and moderate ligamentum flavum hypertrophy. Stable moderate bilateral foraminal stenosis and moderate canal stenosis. L4-5: Interval right-sided laminectomy and ligamentum flavum resection. Persistent disc and facet disease results in moderate bilateral foraminal stenosis and moderate stenosis, although improved from the prior study. L5-S1: Interval right-sided laminectomy. Persistent right greater than left disc and facet disease with moderate right and mild left foraminal stenosis. Mild canal stenosis. IMPRESSION: Motion degraded study. 1. No acute osseous abnormality identified. Stable grade 1 L5-S1 anterolisthesis. 2. Interval right L4-5 and L5-S1 laminectomy with edema in the paraspinal muscles and laminectomy bed. 3. Improved patency of L4-5 spinal canal post laminectomy with moderate persistent L3-4 and L4-5 canal stenosis. 4. Stable multilevel mild to moderate foraminal stenosis. 5. Mild left hydronephrosis of uncertain significance. 6. 28 mm mass within the mid left kidney again seen. Electronically Signed    By: Kristine Garbe M.D.   On: 06/10/2017 06:19   Ct Hip Right Wo Contrast  Result Date: 06/10/2017 CLINICAL DATA:  82 year old male with right hip arthroplasty and pain. EXAM: CT OF THE RIGHT HIP WITHOUT CONTRAST TECHNIQUE: Multidetector CT imaging of the right hip was performed according to the standard protocol. Multiplanar CT image reconstructions were also generated. COMPARISON:  Radiograph dated 06/10/2017 and CT dated 03/24/2017 FINDINGS: Bones/Joint/Cartilage There is a total right hip arthroplasty which appears intact and in anatomic alignment. There is limited evaluation of the bones due to osteopenia and streak artifact caused by arthroplasty. There is no acute fracture or dislocation. Linear corticated density adjacent to the lesser trochanter of the right femur is chronic and was seen on the study of 03/24/2017 and CT of 03/31/2009. Ligaments Suboptimally assessed by CT. Muscles and Tendons No acute findings.  No intramuscular hematoma. Soft tissues There is atherosclerotic calcification of the visualized vasculature. The soft tissues appear unremarkable. IMPRESSION: 1. Right hip arthroplasty appears intact. 2. No acute fracture or dislocation. Electronically Signed   By: Anner Crete M.D.   On: 06/10/2017 03:20   Mr Hip Right Wo Contrast  Result Date: 06/10/2017 CLINICAL DATA:  Right hip pain after a pop while picking up the toilet seat. Previous right hip replacement. Unable the put any pressure on the right hip. EXAM: MR OF THE RIGHT HIP WITHOUT CONTRAST TECHNIQUE: Multiplanar, multisequence MR imaging was performed. No intravenous contrast was administered. COMPARISON:  CT right hip  06/10/2017. Right hip radiographs 06/10/2017. FINDINGS: Bones: Previous right hip arthroplasty. Artifact from the arthroplasty limits evaluation of the right hip. No specific findings to suggest acute fracture. Mild degenerative changes in the lower lumbar spine and left hip. Articular cartilage  and labrum Articular cartilage: Surgically absent due to right hip arthroplasty. Labrum:  Surgically absent due to right hip arthroplasty. Joint or bursal effusion Joint effusion: No effusion demonstrated although examination is limited due to artifact from hip arthroplasty. Bursae: 3.3 cm diameter bursal fluid collection superior to the left greater trochanter. Muscles and tendons Muscles and tendons: Visualized muscles and tendons appear grossly intact although examination is limited due to motion artifact and right hip arthroplasty artifact. Fatty infiltration in the gluteus muscles. Other findings Miscellaneous: Examination is technically limited due to motion artifact. IMPRESSION: Technically limited examination due to motion artifact and artifact from right hip arthroplasty. Given these limitations, no specific evidence for right hip fractures identified. Nondisplaced fractures could be obscured by artifact. Left greater trochanteric bursal collection is demonstrated. Electronically Signed   By: Lucienne Capers M.D.   On: 06/10/2017 06:21   Dg Hip Unilat W Or Wo Pelvis 2-3 Views Right  Result Date: 06/10/2017 CLINICAL DATA:  Patient felt a pop with subsequent right hip pain after leaning over to pick up the toilet seat. EXAM: DG HIP (WITH OR WITHOUT PELVIS) 2-3V RIGHT COMPARISON:  None. FINDINGS: Postoperative right total hip arthroplasty using cemented femoral component and a single screw in the acetabular component. Components appear well seated. No acute dislocation. There is cortical irregularity of the lesser trochanter which appears to be acute in could indicate an avulsion fracture. Diffuse bone demineralization. Degenerative changes in the lower lumbar spine and left hip. Pelvis appears intact. SI joints and symphysis pubis are not displaced. Vascular calcifications. Calcified phleboliths in the pelvis. IMPRESSION: Right total hip arthroplasty. Components appear well seated. Possible acute  avulsion of the lesser trochanter. Electronically Signed   By: Lucienne Capers M.D.   On: 06/10/2017 02:13   IMPRESSION AND PLAN:   * Acute right hip pain likely from arthritis or bursitis. Doesn't seem to be radiation from lumbar spine We'll give 1 dose of IV Decadron and Toradol. Continue patient's Celebrex and oxycodone. Physical therapy evaluation. Pain control. Observation admission. No neurological deficits.  * Spinal stenosis with recent surgery. Discussed with Dr. Aris Lot of neurosurgery who will evaluate the patient. Patient has postop urinary incontinence.  * Hypertension. Continue home medications.  * DVT prophylaxis. No anticoagulation at this time in case he needs a repeat surgery. SCDs ordered.  All the records are reviewed and case discussed with ED provider. Management plans discussed with the patient, family and they are in agreement.  CODE STATUS: FULL CODE  TOTAL TIME TAKING CARE OF THIS PATIENT: 40 minutes.   Hillary Bow R M.D on 06/10/2017 at 9:09 AM  Between 7am to 6pm - Pager - [phone]  After 6pm go to [email or web address] - password [email or web address] [facility]  Office  [phone]  CC: Primary care physician; Rusty Aus, MD  Note: This dictation was prepared with Dragon dictation along with smaller phrase technology. Any transcriptional errors that result from this process are unintentional.'

## 2017-06-11 ENCOUNTER — Other Ambulatory Visit: Payer: Self-pay

## 2017-06-11 ENCOUNTER — Encounter
Admission: RE | Admit: 2017-06-11 | Discharge: 2017-06-11 | Disposition: A | Discharge status: Home / Self Care | Payer: Medicare Other | Source: Ambulatory Visit | Attending: Internal Medicine | Admitting: Internal Medicine

## 2017-06-11 MED ORDER — OXYCODONE HCL 5 MG PO TABS: 5.0000 mg | ORAL_TABLET | ORAL | 0 refills | Status: DC | PRN

## 2017-06-11 NOTE — Discharge Planning (Addendum)
Patient IV removed.  Report called to [place], S/W Jeanmarie Plant, LPN.  DC papers printed, signed, educated and placed in packet.  Signed script in packet.  RN assessment and VS revealed stability for DC.   FU appts suggested and appts made with PCP and OR dr. EMS contacted to transport to room 349 at [facility]. Waiting on arrival.

## 2017-06-11 NOTE — Progress Notes (Signed)
Patient is medically stable for D/C to [facility] today. Per [facility] admissions coordinator at [facility] patient can come today to room 349. RN will call report at [phone] and arrange EMS for transport. Clinical Education officer, museum (CSW) sent D/C orders to [employer] [facility] via [employer]. Patient is aware of above. Patient's daughter [name] is at bedside and aware of above. Please reconsult if future social work needs arise. CSW signing off.   [employer], LCSW [phone]

## 2017-06-11 NOTE — Clinical Social Work Placement (Signed)
   CLINICAL SOCIAL WORK PLACEMENT  NOTE  Date:  06/11/2017  Patient Details  Name: Logan Conner MRN: [id number] Date of Birth: 10/01/1925  Clinical Social Work is seeking post-discharge placement for this patient at the [place] level of care (*CSW will initial, date and re-position this form in  chart as items are completed):  Yes   Patient/family provided with [facility] Work Department's list of facilities offering this level of care within the geographic area requested by the patient (or if unable, by the patient's family).  Yes   Patient/family informed of their freedom to choose among providers that offer the needed level of care, that participate in Medicare, Medicaid or managed care program needed by the patient, have an available bed and are willing to accept the patient.  Yes   Patient/family informed of [facility]'s ownership interest in [facility] and [facility], as well as of the fact that they are under no obligation to receive care at these facilities.  PASRR submitted to EDS on       PASRR number received on       Existing PASRR number confirmed on 06/10/17     FL2 transmitted to all facilities in geographic area requested by pt/family on 06/10/17     FL2 transmitted to all facilities within larger geographic area on       Patient informed that his/her managed care company has contracts with or will negotiate with certain facilities, including the following:        Yes   Patient/family informed of bed offers received.  Patient chooses bed at  [facility] )     Physician recommends and patient chooses bed at      Patient to be transferred to  [facility] ) on 06/11/17.  Patient to be transferred to facility by  [facility] )     Patient family notified on 06/11/17 of transfer.  Name of family member notified:   (Patient's daughter [name] is at bedside and aware of D/C today. )      PHYSICIAN       Additional Comment:    _______________________________________________ Uriel Horkey, Veronia Beets, LCSW 06/11/2017, 11:23 AM

## 2017-06-11 NOTE — Discharge Instructions (Signed)
Activity with assistance. Weightbearing as tolerated.  Low salt diet.

## 2017-06-11 NOTE — Discharge Summary (Signed)
[facility] NAME: Logan Conner    MR#:  [id number]  DATE OF BIRTH:  Sep 06, 1925  DATE OF ADMISSION:  06/10/2017 ADMITTING PHYSICIAN: Hillary Bow, MD  DATE OF DISCHARGE: 06/11/2017  PRIMARY CARE PHYSICIAN: Rusty Aus, MD   ADMISSION DIAGNOSIS:  Lumbar spine pain [M54.5]  DISCHARGE DIAGNOSIS:  Active Problems:   Right hip pain   SECONDARY DIAGNOSIS:   Past Medical History:  Diagnosis Date  . Arthritis   . Glaucoma   . Hyperlipidemia   . Hypertension   . Hyperthyroidism      ADMITTING HISTORY  HISTORY OF PRESENT ILLNESS:  Logan Conner  is a 83 y.o. male with a known history of Hypertension, spinal stenosis with recent surgery, based on ambulation with a walker presents to the emergency room with acute pain in the right hip after he bent down to lift the toilet seat. Patient was unable to put any weight on his right leg. Presented to the emergency room. Here he received 3 doses of morphine and still has inadequate control of pain. MRI of the hip and lumbar spine shows nothing acute. It shows postop changes. CT scan of the right hip was done which shows intact right hip prosthesis. Afebrile. Being admitted for pain control.   HOSPITAL COURSE:   * Acute right hip pain likely from arthritis Or muscle spasm CT scan and MRI of right hip showed no acute problems. MRI lumbar spine showed postoperative changes with no acute findings. No signs of infection. Patient was treated with Toradol and oxycodone as needed and has improved well. Continues to have some pain and difficulty walking due to the right hip pain. Skilled nursing facility recommended by physical therapy. Patient is stable for discharge. He will continue oxycodone as needed and Celebrex scheduled from home. Robaxin for muscle spasms.  Seen by neurosurgery in the hospital due to recent spinal canal stenosis surgery. No further investigations or surgeries  recommended.  * Spinal stenosis with recent surgery. Discussed with Dr. Aris Lot of neurosurgery  * Hypertension. Continue home medications.  Stable for discharge to rehabilitation.  CONSULTS OBTAINED:  Treatment Team:  Bayard Hugger, MD  DRUG ALLERGIES:  No Known Allergies  DISCHARGE MEDICATIONS:   Current Discharge Medication List    CONTINUE these medications which have CHANGED   Details  oxyCODONE (ROXICODONE) 5 MG immediate release tablet Take 1 tablet (5 mg total) by mouth every 4 (four) hours as needed for severe pain. Qty: 20 tablet, Refills: 0      CONTINUE these medications which have NOT CHANGED   Details  acetaminophen (TYLENOL) 650 MG CR tablet Take 1,300 mg by mouth every 8 (eight) hours as needed for pain.    bimatoprost (LUMIGAN) 0.01 % SOLN Place 1 drop into both eyes at bedtime.     brimonidine (ALPHAGAN P) 0.1 % SOLN Apply 1 drop to eye 2 (two) times daily.     budesonide-formoterol (SYMBICORT) 160-4.5 MCG/ACT inhaler Inhale 2 puffs into the lungs 2 (two) times daily.    celecoxib (CELEBREX) 200 MG capsule Take 1 capsule (200 mg total) by mouth every 12 (twelve) hours. Qty: 60 capsule, Refills: 0    cholecalciferol (VITAMIN D) 1000 units tablet Take 1,000 Units by mouth daily.    hydroxychloroquine (PLAQUENIL) 200 MG tablet TAKE 1 TABLET BY MOUTH ONCE DAILY.    levothyroxine (SYNTHROID) 50 MCG tablet TAKE 1 TABLET BY MOUTH EVERY MORNING ON AN EMPTY STOMACH. AT LEAST  30-60 MINUTESBEFORE BREAKFAST, WITH A GLASS OF WATER    methocarbamol (ROBAXIN) 500 MG tablet Take 1 tablet (500 mg total) by mouth every 6 (six) hours as needed for muscle spasms. Qty: 90 tablet, Refills: 0    metoprolol succinate (TOPROL-XL) 50 MG 24 hr tablet TAKE ONE TABLET BY MOUTH ONCE DAILY    montelukast (SINGULAIR) 10 MG tablet Take 10 mg by mouth at bedtime.     Multiple Vitamin (MULTIVITAMIN WITH MINERALS) TABS tablet Take 1 tablet by mouth daily.    olmesartan (BENICAR) 40  MG tablet TAKE 1 TABLET BY MOUTH ONCE DAILY.    omeprazole (PRILOSEC) 10 MG capsule Take 10 mg by mouth daily as needed (indigestion).     simvastatin (ZOCOR) 40 MG tablet TAKE 1 TABLET BY MOUTH EVERY NIGHT.    triamterene-hydrochlorothiazide (MAXZIDE) 75-50 MG tablet TAKE 0.5 TABLET BY MOUTH ONCE DAILY.    diclofenac sodium (VOLTAREN) 1 % GEL Apply 2 g topically 2 (two) times daily.      STOP taking these medications     methylPREDNISolone (MEDROL DOSEPAK) 4 MG TBPK tablet      mupirocin ointment (BACTROBAN) 2 %         Today   VITAL SIGNS:  Blood pressure (!) 164/78, pulse 79, temperature 98.3 F (36.8 C), temperature source Oral, resp. rate 20, height 5\' 10"  (1.778 m), weight 73 kg (161 lb), SpO2 97 %.  I/O:  No intake or output data in the 24 hours ending 06/11/17 0950  PHYSICAL EXAMINATION:  Physical Exam  GENERAL:  83 y.o.-year-old patient lying in the bed with no acute distress. Decreased hearing LUNGS: Normal breath sounds bilaterally, no wheezing, rales,rhonchi or crepitation. No use of accessory muscles of respiration.  CARDIOVASCULAR: S1, S2 normal. No murmurs, rubs, or gallops.  ABDOMEN: Soft, non-tender, non-distended. Bowel sounds present. No organomegaly or mass.  NEUROLOGIC: Moves all 4 extremities. PSYCHIATRIC: The patient is alert and oriented x 3.  SKIN: No obvious rash, lesion, or ulcer.   DATA REVIEW:   CBC  Recent Labs Lab 06/10/17 0141  WBC 12.1*  HGB 14.6  HCT 42.9  PLT 204    Chemistries   Recent Labs Lab 06/10/17 0141  NA 141  K 4.6  CL 108  CO2 23  GLUCOSE 150*  BUN 27*  CREATININE 0.99  CALCIUM 9.5    Cardiac Enzymes No results for input(s): TROPONINI in the last 168 hours.  Microbiology Results  Results for orders placed or performed during the hospital encounter of 06/02/17  Surgical pcr screen     Status: Abnormal   Collection Time: 06/02/17 12:36 PM  Result Value Ref Range Status   MRSA, PCR POSITIVE (A)  NEGATIVE Final    Comment: RESULT CALLED TO, READ BACK BY AND VERIFIED WITH: ASHLEY GREEN 06/02/17 1422 KLW    Staphylococcus aureus POSITIVE (A) NEGATIVE Final    Comment:        The Xpert SA Assay (FDA approved for NASAL specimens in patients over 65 years of age), is one component of a comprehensive surveillance program.  Test performance has been validated by [facility] for patients greater than or equal to 23 year old. It is not intended to diagnose infection nor to guide or monitor treatment.     RADIOLOGY:  Mr Lumbar Spine Wo Contrast  Result Date: 06/10/2017 CLINICAL DATA:  83 y/o M; lumbar spine pain. Laminectomy decompression 06/04/2017 at the L4 through S1 levels. EXAM: MRI LUMBAR SPINE WITHOUT CONTRAST TECHNIQUE: Multiplanar,  multisequence MR imaging of the lumbar spine was performed. No intravenous contrast was administered. COMPARISON:  03/07/2017 lumbar spine MRI. FINDINGS: Segmentation:  Standard. Alignment:  Grade 1 L5-S1 anterolisthesis is stable. Vertebrae: Mild L2-3 degenerative endplate edema. Interval postsurgical changes related to right-sided L4-5 and L5-S1 laminectomy. Edema within paraspinal muscles and subcutaneous fat extending to the laminectomy beds. Conus medullaris: Extends to the L1 Level and appears normal. Paraspinal and other soft tissues: Mild left hydronephrosis. 28 mm mass within the mid left kidney. Disc levels: L1-2: Stable disc bulge with facet and ligamentum flavum hypertrophy. Stable mild bilateral foraminal stenosis and mild canal stenosis. L2-3: Stable moderate disc bulge, marginal endplate osteophytes, with facet and ligamentum flavum hypertrophy. Stable moderate bilateral foraminal stenosis and mild canal stenosis. L3-4: Stable small disc bulge, endplate marginal osteophytes, moderate facet, and moderate ligamentum flavum hypertrophy. Stable moderate bilateral foraminal stenosis and moderate canal stenosis. L4-5: Interval right-sided laminectomy  and ligamentum flavum resection. Persistent disc and facet disease results in moderate bilateral foraminal stenosis and moderate stenosis, although improved from the prior study. L5-S1: Interval right-sided laminectomy. Persistent right greater than left disc and facet disease with moderate right and mild left foraminal stenosis. Mild canal stenosis. IMPRESSION: Motion degraded study. 1. No acute osseous abnormality identified. Stable grade 1 L5-S1 anterolisthesis. 2. Interval right L4-5 and L5-S1 laminectomy with edema in the paraspinal muscles and laminectomy bed. 3. Improved patency of L4-5 spinal canal post laminectomy with moderate persistent L3-4 and L4-5 canal stenosis. 4. Stable multilevel mild to moderate foraminal stenosis. 5. Mild left hydronephrosis of uncertain significance. 6. 28 mm mass within the mid left kidney again seen. Electronically Signed   By: Kristine Garbe M.D.   On: 06/10/2017 06:19   Ct Hip Right Wo Contrast  Result Date: 06/10/2017 CLINICAL DATA:  83 year old male with right hip arthroplasty and pain. EXAM: CT OF THE RIGHT HIP WITHOUT CONTRAST TECHNIQUE: Multidetector CT imaging of the right hip was performed according to the standard protocol. Multiplanar CT image reconstructions were also generated. COMPARISON:  Radiograph dated 06/10/2017 and CT dated 03/24/2017 FINDINGS: Bones/Joint/Cartilage There is a total right hip arthroplasty which appears intact and in anatomic alignment. There is limited evaluation of the bones due to osteopenia and streak artifact caused by arthroplasty. There is no acute fracture or dislocation. Linear corticated density adjacent to the lesser trochanter of the right femur is chronic and was seen on the study of 03/24/2017 and CT of 03/31/2009. Ligaments Suboptimally assessed by CT. Muscles and Tendons No acute findings.  No intramuscular hematoma. Soft tissues There is atherosclerotic calcification of the visualized vasculature. The soft  tissues appear unremarkable. IMPRESSION: 1. Right hip arthroplasty appears intact. 2. No acute fracture or dislocation. Electronically Signed   By: Anner Crete M.D.   On: 06/10/2017 03:20   Mr Hip Right Wo Contrast  Result Date: 06/10/2017 CLINICAL DATA:  Right hip pain after a pop while picking up the toilet seat. Previous right hip replacement. Unable the put any pressure on the right hip. EXAM: MR OF THE RIGHT HIP WITHOUT CONTRAST TECHNIQUE: Multiplanar, multisequence MR imaging was performed. No intravenous contrast was administered. COMPARISON:  CT right hip 06/10/2017. Right hip radiographs 06/10/2017. FINDINGS: Bones: Previous right hip arthroplasty. Artifact from the arthroplasty limits evaluation of the right hip. No specific findings to suggest acute fracture. Mild degenerative changes in the lower lumbar spine and left hip. Articular cartilage and labrum Articular cartilage: Surgically absent due to right hip arthroplasty. Labrum:  Surgically absent due  to right hip arthroplasty. Joint or bursal effusion Joint effusion: No effusion demonstrated although examination is limited due to artifact from hip arthroplasty. Bursae: 3.3 cm diameter bursal fluid collection superior to the left greater trochanter. Muscles and tendons Muscles and tendons: Visualized muscles and tendons appear grossly intact although examination is limited due to motion artifact and right hip arthroplasty artifact. Fatty infiltration in the gluteus muscles. Other findings Miscellaneous: Examination is technically limited due to motion artifact. IMPRESSION: Technically limited examination due to motion artifact and artifact from right hip arthroplasty. Given these limitations, no specific evidence for right hip fractures identified. Nondisplaced fractures could be obscured by artifact. Left greater trochanteric bursal collection is demonstrated. Electronically Signed   By: Lucienne Capers M.D.   On: 06/10/2017 06:21   Dg Hip  Unilat W Or Wo Pelvis 2-3 Views Right  Result Date: 06/10/2017 CLINICAL DATA:  Patient felt a pop with subsequent right hip pain after leaning over to pick up the toilet seat. EXAM: DG HIP (WITH OR WITHOUT PELVIS) 2-3V RIGHT COMPARISON:  None. FINDINGS: Postoperative right total hip arthroplasty using cemented femoral component and a single screw in the acetabular component. Components appear well seated. No acute dislocation. There is cortical irregularity of the lesser trochanter which appears to be acute in could indicate an avulsion fracture. Diffuse bone demineralization. Degenerative changes in the lower lumbar spine and left hip. Pelvis appears intact. SI joints and symphysis pubis are not displaced. Vascular calcifications. Calcified phleboliths in the pelvis. IMPRESSION: Right total hip arthroplasty. Components appear well seated. Possible acute avulsion of the lesser trochanter. Electronically Signed   By: Lucienne Capers M.D.   On: 06/10/2017 02:13    Follow up with PCP in 1 week.  Management plans discussed with the patient, family and they are in agreement.  CODE STATUS:     Code Status Orders        Start     Ordered   06/10/17 0812  Full code  Continuous     06/10/17 0812    Code Status History    Date Active Date Inactive Code Status Order ID Comments User Context   06/04/2017 11:18 AM 06/05/2017  4:51 PM Full Code [id number]  Meade Maw, MD Inpatient    Advance Directive Documentation     Most Recent Value  Type of Advance Directive  Healthcare Power of Attorney  Pre-existing out of facility DNR order (yellow form or pink MOST form)  -  "MOST" Form in Place?  -      TOTAL TIME TAKING CARE OF THIS PATIENT ON DAY OF DISCHARGE: more than 30 minutes.   Hillary Bow R M.D on 06/11/2017 at 9:50 AM  Between 7am to 6pm - Pager - [phone]  After 6pm go to [email or web address] - password [email or web address] [facility]  Office  [phone]  CC: Primary  care physician; Rusty Aus, MD  Note: This dictation was prepared with Dragon dictation along with smaller phrase technology. Any transcriptional errors that result from this process are unintentional.

## 2017-06-11 NOTE — Telephone Encounter (Signed)
Rx sent to [facility] phone : [phone] , fax : [phone]  

## 2017-06-11 NOTE — Care Management (Signed)
Notified [facility] that patient will be discharging to [facility].

## 2017-06-18 ENCOUNTER — Non-Acute Institutional Stay (SKILLED_NURSING_FACILITY): Payer: Medicare Other | Admitting: Gerontology

## 2017-06-18 DIAGNOSIS — M25551 Pain in right hip: Secondary | ICD-10-CM

## 2017-06-18 DIAGNOSIS — M48062 Spinal stenosis, lumbar region with neurogenic claudication: Secondary | ICD-10-CM | POA: Diagnosis not present

## 2017-06-19 ENCOUNTER — Encounter: Payer: Self-pay | Admitting: Gerontology

## 2017-06-19 NOTE — Progress Notes (Signed)
Location:   [facility] Room Number: [phone] Place of Service:  SNF [phone]) Provider:  Toni Arthurs, NP-C  Rusty Aus, MD  Patient Care Team: Rusty Aus, MD as PCP - General (Internal Medicine)  Extended Emergency Contact Information Primary Emergency Contact: Axelrod,Lois L Address: [address]          [place], [place] [address] [place] Phone: [phone] Relation: Spouse Secondary Emergency Contact: Anderson,Catherine  [place] Mobile Phone: [phone] Relation: Daughter  Code Status:  FULL Goals of care: Advanced Directive information Advanced Directives 06/19/2017  Does Patient Have a Medical Advance Directive? No  Type of Advance Directive -  Does patient want to make changes to medical advance directive? -  Copy of Organ in Chart? -     Chief Complaint  Patient presents with  . Medical Management of Chronic Issues    Routine Visit    HPI:  Pt is a 83 y.o. male seen today for follow up. Pt was admitted to the facility for rehab following visit to the ED at [facility] for worsening pain in the Right hip. Pt underwent lumbar spinal surgery recently. Pain is manageable in the spine. However, pt has been having worsening pain in the hip. He has been participating in PT and OT while at the facility. He has been utilizing Oxycodone, Tylenol, Methocarbamol and Voltaren for pain. He reports improvement, but not satisfactory pain control. Pt reports his appetite is good and he is having regular BMs. Pt denies n/v/d/f/c/cp/sob/ha/abd pain/dizziness. He is ambulating in the room and in the hallways independently with the walker. VSS. No other complaints.    Past Medical History:  Diagnosis Date  . Arthritis   . BPH (benign prostatic hyperplasia) 05/03/2014  . Cataract cortical, senile   . CKD (chronic kidney disease), stage III 05/03/2014  . Generalized OA 05/03/2014  . Glaucoma   .  Hyperlipidemia   . Hyperlipidemia, unspecified 05/03/2014  . Hypertension 05/03/2014  . Hyperthyroidism   . Kidney stone 05/03/2014   Past Surgical History:  Procedure Laterality Date  . CATARACT EXTRACTION Right   . COLONOSCOPY  02/21/2006  . ESOPHAGOGASTRODUODENOSCOPY  02/21/2006  . JOINT REPLACEMENT Right    total hip  . kidney stone    . LITHOTRIPSY    . LUMBAR LAMINECTOMY/DECOMPRESSION MICRODISCECTOMY Right 06/04/2017   Procedure: LUMBAR LAMINECTOMY/DECOMPRESSION MICRODISCECTOMY 2 LEVELS L4-S1;  Surgeon: Meade Maw, MD;  Location: [facility] ORS;  Service: Neurosurgery;  Laterality: Right;  . TOTAL HIP ARTHROPLASTY Right     No Known Allergies  Allergies as of 06/18/2017   No Known Allergies     Medication List       Accurate as of 06/18/17 11:59 PM. Always use your most recent med list.          acetaminophen 650 MG CR tablet Commonly known as:  TYLENOL Take 1,300 mg by mouth every 8 (eight) hours as needed for pain.   ASPERCREME LIDOCAINE 4 % Ptch Generic drug:  Lidocaine Apply 1 patch topically daily. Apply to right hip (area of trochanter) for pain. Remove after 12 hours.   BENICAR 40 MG tablet Generic drug:  olmesartan TAKE 1 TABLET BY MOUTH ONCE DAILY.   brimonidine 0.1 % Soln Commonly known as:  ALPHAGAN P Apply 1 drop to eye 2 (two) times daily.   budesonide-formoterol 160-4.5 MCG/ACT inhaler Commonly known as:  SYMBICORT Inhale 2 puffs into the lungs 2 (two) times  daily.   celecoxib 200 MG capsule Commonly known as:  CELEBREX Take 1 capsule (200 mg total) by mouth every 12 (twelve) hours.   CEROVITE SENIOR Tabs Take 1 tablet by mouth daily.   cholecalciferol 1000 units tablet Commonly known as:  VITAMIN D Take 1,000 Units by mouth daily.   diclofenac sodium 1 % Gel Commonly known as:  VOLTAREN Apply 2 g topically 2 (two) times daily. Apply to right hip for pain   hydroxychloroquine 200 MG tablet Commonly known as:  PLAQUENIL TAKE 1  TABLET BY MOUTH ONCE DAILY.   LUMIGAN 0.01 % Soln Generic drug:  bimatoprost Place 1 drop into both eyes at bedtime.   methocarbamol 500 MG tablet Commonly known as:  ROBAXIN Take 1 tablet (500 mg total) by mouth every 6 (six) hours as needed for muscle spasms.   metoprolol succinate 50 MG 24 hr tablet Commonly known as:  TOPROL-XL TAKE ONE TABLET BY MOUTH ONCE DAILY   montelukast 10 MG tablet Commonly known as:  SINGULAIR Take 10 mg by mouth at bedtime.   omeprazole 10 MG capsule Commonly known as:  PRILOSEC Take 10 mg by mouth daily as needed (indigestion).   oxyCODONE 5 MG immediate release tablet Commonly known as:  ROXICODONE Take 1 tablet (5 mg total) by mouth every 4 (four) hours as needed for severe pain.   simvastatin 40 MG tablet Commonly known as:  ZOCOR Take 40 mg by mouth daily at 6 PM.   SYNTHROID 50 MCG tablet Generic drug:  levothyroxine TAKE 1 TABLET BY MOUTH EVERY MORNING ON AN EMPTY STOMACH. AT LEAST 30-60 MINUTESBEFORE BREAKFAST, WITH A GLASS OF WATER   triamterene-hydrochlorothiazide 75-50 MG tablet Commonly known as:  MAXZIDE Take 0.5 tablets by mouth daily. Take 1/2 tab       Review of Systems  Constitutional: Negative for activity change, appetite change, chills, diaphoresis and fever.  HENT: Negative for congestion, sneezing, sore throat, trouble swallowing and voice change.   Respiratory: Negative for apnea, cough, choking, chest tightness, shortness of breath and wheezing.   Cardiovascular: Negative for chest pain, palpitations and leg swelling.  Gastrointestinal: Negative for abdominal distention, abdominal pain, constipation, diarrhea and nausea.  Genitourinary: Negative for difficulty urinating, dysuria, frequency and urgency.  Musculoskeletal: Positive for arthralgias (typical arthritis), back pain and myalgias. Negative for gait problem.  Skin: Positive for wound. Negative for color change, pallor and rash.  Neurological: Positive for  weakness (improved). Negative for dizziness, tremors, syncope, speech difficulty, numbness and headaches.  Psychiatric/Behavioral: Negative for agitation and behavioral problems.  All other systems reviewed and are negative.   Immunization History  Administered Date(s) Administered  . Influenza-Unspecified 08/06/2012, 08/22/2016  . Pneumococcal Conjugate-13 11/07/2016  . Pneumococcal Polysaccharide-23 08/26/2007  . Tdap 04/22/2012  . Zoster 03/21/2010   Pertinent  Health Maintenance Due  Topic Date Due  . PNA vac Low Risk Adult (1 of 2 - PCV13) 05/27/1992  . INFLUENZA VACCINE  06/11/2017   No flowsheet data found. Functional Status Survey:    Vitals:   06/18/17 1154  BP: (!) 153/83  Pulse: 72  Resp: 18  Temp: 97.7 F (36.5 C)  SpO2: 100%  Weight: 154 lb 14.4 oz (70.3 kg)  Height: 5\' 10"  (1.778 m)   Body mass index is 22.23 kg/m. Physical Exam  Constitutional: He is oriented to person, place, and time. Vital signs are normal. He appears well-developed and well-nourished. He is active and cooperative. He does not appear ill. No distress.  HENT:  Head: Normocephalic and atraumatic.  Mouth/Throat: Uvula is midline, oropharynx is clear and moist and mucous membranes are normal. Mucous membranes are not pale, not dry and not cyanotic.  Eyes: Pupils are equal, round, and reactive to light. Conjunctivae, EOM and lids are normal.  Neck: Trachea normal, normal range of motion and full passive range of motion without pain. Neck supple. No JVD present. No tracheal deviation, no edema and no erythema present. No thyromegaly present.  Cardiovascular: Normal rate, regular rhythm, normal heart sounds and intact distal pulses.  Exam reveals no gallop, no distant heart sounds and no friction rub.   No murmur heard. Pulses:      Dorsalis pedis pulses are 2+ on the right side, and 2+ on the left side.  Pulmonary/Chest: Effort normal and breath sounds normal. No accessory muscle usage. No  respiratory distress. He has no decreased breath sounds. He has no wheezes. He has no rhonchi. He has no rales. He exhibits no tenderness.  Abdominal: Normal appearance and bowel sounds are normal. He exhibits no distension and no ascites. There is no tenderness.  Musculoskeletal: He exhibits no edema.       Right hip: He exhibits decreased range of motion and tenderness.       Lumbar back: He exhibits laceration (incision).  Expected osteoarthritis, stiffness. Calves soft, supple. Negative Homan's sign  Neurological: He is alert and oriented to person, place, and time. He has normal strength.  Skin: Skin is warm and dry. Laceration (well approximated with Dermabond) noted. No rash noted. He is not diaphoretic. No cyanosis or erythema. No pallor. Nails show no clubbing.     Psychiatric: He has a normal mood and affect. His speech is normal and behavior is normal. Judgment and thought content normal. Cognition and memory are normal.  Nursing note and vitals reviewed.   Labs reviewed:  Recent Labs  06/10/17 0141  NA 141  K 4.6  CL 108  CO2 23  GLUCOSE 150*  BUN 27*  CREATININE 0.99  CALCIUM 9.5   No results for input(s): AST, ALT, ALKPHOS, BILITOT, PROT, ALBUMIN in the last 8760 hours.  Recent Labs  06/10/17 0141  WBC 12.1*  HGB 14.6  HCT 42.9  MCV 98.3  PLT 204   No results found for: TSH No results found for: HGBA1C No results found for: CHOL, HDL, LDLCALC, LDLDIRECT, TRIG, CHOLHDL  Significant Diagnostic Results in last 30 days:  Dg Lumbar Spine 2-3 Views  Result Date: 06/04/2017 CLINICAL DATA:  Elective surgery EXAM: LUMBAR SPINE - 2-3 VIEW; DG C-ARM 61-120 MIN COMPARISON:  MRI 03/07/2017 FINDINGS: Multiple intraoperative spot images are submitted for localization. Initial imaging demonstrates posterior surgical instrument along the posterior skin surface directed at the L3-4 disc space. Subsequent imaging demonstrates posterior surgical instrument directed at the L4  vertebral body, followed by directed at the L5-S1 disc space. IMPRESSION: Intraoperative localization as above. Electronically Signed   By: Rolm Baptise M.D.   On: 06/04/2017 09:28   Mr Lumbar Spine Wo Contrast  Result Date: 06/10/2017 CLINICAL DATA:  83 y/o M; lumbar spine pain. Laminectomy decompression 06/04/2017 at the L4 through S1 levels. EXAM: MRI LUMBAR SPINE WITHOUT CONTRAST TECHNIQUE: Multiplanar, multisequence MR imaging of the lumbar spine was performed. No intravenous contrast was administered. COMPARISON:  03/07/2017 lumbar spine MRI. FINDINGS: Segmentation:  Standard. Alignment:  Grade 1 L5-S1 anterolisthesis is stable. Vertebrae: Mild L2-3 degenerative endplate edema. Interval postsurgical changes related to right-sided L4-5 and L5-S1 laminectomy. Edema within paraspinal muscles  and subcutaneous fat extending to the laminectomy beds. Conus medullaris: Extends to the L1 Level and appears normal. Paraspinal and other soft tissues: Mild left hydronephrosis. 28 mm mass within the mid left kidney. Disc levels: L1-2: Stable disc bulge with facet and ligamentum flavum hypertrophy. Stable mild bilateral foraminal stenosis and mild canal stenosis. L2-3: Stable moderate disc bulge, marginal endplate osteophytes, with facet and ligamentum flavum hypertrophy. Stable moderate bilateral foraminal stenosis and mild canal stenosis. L3-4: Stable small disc bulge, endplate marginal osteophytes, moderate facet, and moderate ligamentum flavum hypertrophy. Stable moderate bilateral foraminal stenosis and moderate canal stenosis. L4-5: Interval right-sided laminectomy and ligamentum flavum resection. Persistent disc and facet disease results in moderate bilateral foraminal stenosis and moderate stenosis, although improved from the prior study. L5-S1: Interval right-sided laminectomy. Persistent right greater than left disc and facet disease with moderate right and mild left foraminal stenosis. Mild canal stenosis.  IMPRESSION: Motion degraded study. 1. No acute osseous abnormality identified. Stable grade 1 L5-S1 anterolisthesis. 2. Interval right L4-5 and L5-S1 laminectomy with edema in the paraspinal muscles and laminectomy bed. 3. Improved patency of L4-5 spinal canal post laminectomy with moderate persistent L3-4 and L4-5 canal stenosis. 4. Stable multilevel mild to moderate foraminal stenosis. 5. Mild left hydronephrosis of uncertain significance. 6. 28 mm mass within the mid left kidney again seen. Electronically Signed   By: Kristine Garbe M.D.   On: 06/10/2017 06:19   Ct Hip Right Wo Contrast  Result Date: 06/10/2017 CLINICAL DATA:  83 year old male with right hip arthroplasty and pain. EXAM: CT OF THE RIGHT HIP WITHOUT CONTRAST TECHNIQUE: Multidetector CT imaging of the right hip was performed according to the standard protocol. Multiplanar CT image reconstructions were also generated. COMPARISON:  Radiograph dated 06/10/2017 and CT dated 03/24/2017 FINDINGS: Bones/Joint/Cartilage There is a total right hip arthroplasty which appears intact and in anatomic alignment. There is limited evaluation of the bones due to osteopenia and streak artifact caused by arthroplasty. There is no acute fracture or dislocation. Linear corticated density adjacent to the lesser trochanter of the right femur is chronic and was seen on the study of 03/24/2017 and CT of 03/31/2009. Ligaments Suboptimally assessed by CT. Muscles and Tendons No acute findings.  No intramuscular hematoma. Soft tissues There is atherosclerotic calcification of the visualized vasculature. The soft tissues appear unremarkable. IMPRESSION: 1. Right hip arthroplasty appears intact. 2. No acute fracture or dislocation. Electronically Signed   By: Anner Crete M.D.   On: 06/10/2017 03:20   Mr Hip Right Wo Contrast  Result Date: 06/10/2017 CLINICAL DATA:  Right hip pain after a pop while picking up the toilet seat. Previous right hip replacement.  Unable the put any pressure on the right hip. EXAM: MR OF THE RIGHT HIP WITHOUT CONTRAST TECHNIQUE: Multiplanar, multisequence MR imaging was performed. No intravenous contrast was administered. COMPARISON:  CT right hip 06/10/2017. Right hip radiographs 06/10/2017. FINDINGS: Bones: Previous right hip arthroplasty. Artifact from the arthroplasty limits evaluation of the right hip. No specific findings to suggest acute fracture. Mild degenerative changes in the lower lumbar spine and left hip. Articular cartilage and labrum Articular cartilage: Surgically absent due to right hip arthroplasty. Labrum:  Surgically absent due to right hip arthroplasty. Joint or bursal effusion Joint effusion: No effusion demonstrated although examination is limited due to artifact from hip arthroplasty. Bursae: 3.3 cm diameter bursal fluid collection superior to the left greater trochanter. Muscles and tendons Muscles and tendons: Visualized muscles and tendons appear grossly intact although examination is  limited due to motion artifact and right hip arthroplasty artifact. Fatty infiltration in the gluteus muscles. Other findings Miscellaneous: Examination is technically limited due to motion artifact. IMPRESSION: Technically limited examination due to motion artifact and artifact from right hip arthroplasty. Given these limitations, no specific evidence for right hip fractures identified. Nondisplaced fractures could be obscured by artifact. Left greater trochanteric bursal collection is demonstrated. Electronically Signed   By: Lucienne Capers M.D.   On: 06/10/2017 06:21   Dg C-arm 61-120 Min  Result Date: 06/04/2017 CLINICAL DATA:  Elective surgery EXAM: LUMBAR SPINE - 2-3 VIEW; DG C-ARM 61-120 MIN COMPARISON:  MRI 03/07/2017 FINDINGS: Multiple intraoperative spot images are submitted for localization. Initial imaging demonstrates posterior surgical instrument along the posterior skin surface directed at the L3-4 disc space.  Subsequent imaging demonstrates posterior surgical instrument directed at the L4 vertebral body, followed by directed at the L5-S1 disc space. IMPRESSION: Intraoperative localization as above. Electronically Signed   By: Rolm Baptise M.D.   On: 06/04/2017 09:28   Dg Hip Unilat W Or Wo Pelvis 2-3 Views Right  Result Date: 06/10/2017 CLINICAL DATA:  Patient felt a pop with subsequent right hip pain after leaning over to pick up the toilet seat. EXAM: DG HIP (WITH OR WITHOUT PELVIS) 2-3V RIGHT COMPARISON:  None. FINDINGS: Postoperative right total hip arthroplasty using cemented femoral component and a single screw in the acetabular component. Components appear well seated. No acute dislocation. There is cortical irregularity of the lesser trochanter which appears to be acute in could indicate an avulsion fracture. Diffuse bone demineralization. Degenerative changes in the lower lumbar spine and left hip. Pelvis appears intact. SI joints and symphysis pubis are not displaced. Vascular calcifications. Calcified phleboliths in the pelvis. IMPRESSION: Right total hip arthroplasty. Components appear well seated. Possible acute avulsion of the lesser trochanter. Electronically Signed   By: Lucienne Capers M.D.   On: 06/10/2017 02:13    Assessment/Plan 1. Pain right hip  Continue Oxycodone 5 mg- 1 tablet po Q 4 hours prn  Continue methocarbamol 500 mg po Q 6 hours prn  Continue Voltaren 1% gel- 2 grams BID  Continue Tylenol CR 650 mg- 2 tablets po Q 8 hours prn  Add Aspercreme 4% (Lidocaine) patch- apply to the right hip Q day. Remove after 12 hours.   2. Neurogenic claudication due to lumbar spinal stenosis  Stable  Improved   Family/ staff Communication:   Total Time:  Documentation:  Face to Face:  Family/Phone:   Labs/tests ordered:    Medication list reviewed and assessed for continued appropriateness. Monthly medication orders reviewed and signed.  Vikki Ports,  NP-C Geriatrics [facility] [phone], [place] [address] Cell Phone (Mon-Fri 8am-5pm):  [phone] On Call:  [phone] & follow prompts after 5pm & weekends Office Phone:  [phone] Office Fax:  [phone]

## 2017-06-25 NOTE — Progress Notes (Signed)
06/10/17 1504  PT Visit Information  Last PT Received On 06/10/17  Assistance Needed +1  History of Present Illness Pt is a 83 yo M, admitted to acute care on 7/31 for R hip pain. Prior to [place], pt modI with amb, utilizing RW. Of note, pt underwent lumbar laminectomy/decompression microdiscectomy last week, and was receiving HHPT prior to admission. PMH: arthritis, glaucoma, HLD, HTN, and hyperthyroidism.   Precautions  Precautions Fall;Back  Precaution Booklet Issued No  Precaution Comments No bending, lifting, twisting  Restrictions  Weight Bearing Restrictions No  Home Living  Family/patient expects to be discharged to: Private residence  Living Arrangements Spouse/significant other  Available Help at Discharge Family;Available 24 hours/day;Other (Comment);Personal care attendant  Type of [place] to enter  Entrance Stairs-Number of Steps 4  Entrance Stairs-Rails Right  [place] to live on main level with bedroom/bathroom  [place] - single point;Walker - 2 wheels  Additional Comments 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 Pt seeing HHPT 3x/wk prior to current admission. Per pt daughter: 24 hr assist does not include physical assistance; they are an aid for cooking and cleaning.   Prior Function  Level of Independence Needs assistance  Communication  Communication No difficulties  Pain Assessment  Pain Assessment Faces  Faces Pain Scale 4  Pain Location R hip  (with movement and WB)  Pain Descriptors / Indicators Discomfort;Nagging;Grimacing  Pain Intervention(s) Monitored during session;Limited activity within patient's tolerance;Premedicated before session  Cognition  Arousal/Alertness Awake/alert  Behavior During Therapy [facility] for tasks assessed/performed  Overall Cognitive Status Within Functional Limits for tasks assessed  Upper Extremity Assessment  Upper  Extremity Assessment Overall WFL for tasks assessed  Lower Extremity Assessment  Lower Extremity Assessment Generalized weakness (MMT to B LE's grossly 4/5)  Bed Mobility  Overal bed mobility Needs Assistance  Bed Mobility Supine to Sit  Supine to sit Min assist  General bed mobility comments MinA with supine to sit, requiring min verbal/tactile/visual cues for mechanics and safety. Pt education re: log rolling technique. Pt verbalized understanding, but required physical assist to perform   Transfers  Overall transfer level Needs assistance  Equipment used Rolling walker (2 wheeled)  Transfers Sit to/from Stand  Sit to Stand Min assist  General transfer comment MinA with STS transfer, with heavy reliance on leaning back of LE's on EOB upon standing, to gain balance; requires mod cues for mechanics and safety. Pt education re: "nose over toes," and hand/ft placement. Pt verbalized understanding, but was hesitant to flex at hips for leaning forward.  Ambulation/Gait  Ambulation/Gait assistance Min assist  Ambulation Distance (Feet) 40 Feet  Assistive device Rolling walker (2 wheeled)  Gait Pattern/deviations Step-through pattern;Decreased step length - right;Decreased step length - left  General Gait Details Pt initially with increased postural sway, requiring increased time to initiate amb. Reports increased pain to R hip with WB. Once initiating gait, pt with good mechanics using RW.   Gait velocity Decreased  Balance  Overall balance assessment Needs assistance  Sitting-balance support Bilateral upper extremity supported;Feet supported  Sitting balance-Leahy Scale Fair  Sitting balance - Comments Pt required CGA-MinA with sitting balance. Initially requiring MINA, progressing to CGA with min cue to sit with erect posture and keep B ft on the floor for added stability.   Standing balance support Bilateral upper extremity supported  Standing balance-Leahy Scale Fair  Standing balance  comment Fair standing balance, requiring b UE support from RW and demonstratign increased post postural sway with immediate standing, which required minA to correct.   Exercises  Exercises Other exercises  Other Exercises  Other Exercises Supine therex performed to B LE's with supervision x 10 reps: ankle pumps, SLR, hip abd, and glute sets. Pt with good mechancis and safety, requiring no cues.   PT - End of Session  Equipment Utilized During Treatment Gait belt  Activity Tolerance Patient tolerated treatment well  Patient left in chair;with call bell/phone within reach;with chair alarm set;with family/visitor present  Nurse Communication Other (comment)  PT Assessment  PT Recommendation/Assessment Patient needs continued PT services  PT Visit Diagnosis Unsteadiness on feet (R26.81);Other abnormalities of gait and mobility (R26.89);Muscle weakness (generalized) (M62.81);Pain  PT Problem List Decreased strength;Decreased activity tolerance;Decreased balance;Decreased mobility;Decreased coordination;Decreased safety awareness;Pain  PT Plan  PT Frequency (ACUTE ONLY) Min 2X/week  PT Treatment/Interventions (ACUTE ONLY) DME instruction;Gait training;Stair training;Functional mobility training;Therapeutic activities;Therapeutic exercise;Balance training;Neuromuscular re-education;Patient/family education;Manual techniques  AM-PAC PT "6 Clicks" Daily Activity Outcome Measure  Difficulty turning over in bed (including adjusting bedclothes, sheets and blankets)? 1  Difficulty moving from lying on back to sitting on the side of the bed?  1  Difficulty sitting down on and standing up from a chair with arms (e.g., wheelchair, bedside commode, etc,.)? 1  Help needed moving to and from a bed to chair (including a wheelchair)? 3  Help needed walking in hospital room? 3  Help needed climbing 3-5 steps with a railing?  2  6 Click Score 11  Mobility G Code  CL  PT Recommendation  Follow Up Recommendations  SNF  PT equipment None recommended by PT  Individuals Consulted  Consulted and Agree with Results and Recommendations Patient;Family member/caregiver  Family Member Consulted Daughter  Acute Rehab PT Goals  Patient Stated Goal To get strong again  PT Goal Formulation With patient/family  Time For Goal Achievement 06/24/17  Potential to Achieve Goals Good  PT Time Calculation  PT Start Time (ACUTE ONLY) 1421  PT Stop Time (ACUTE ONLY) 1459  PT Time Calculation (min) (ACUTE ONLY) 38 min  PT G-Codes **NOT FOR INPATIENT CLASS**  Functional Assessment Tool Used AM-PAC 6 Clicks Basic Mobility  Functional Limitation Mobility: Walking and moving around  Mobility: Walking and Moving Around Current Status (B0962) CL  Mobility: Walking and Moving Around Goal Status (E3662) CI    Late-entry g-codes added after review of initial evaluation/documentation by Cy Blamer, PT.  Reyes Ivan. Owens Shark, PT, DPT, NCS 06/25/17, 8:45 AM [phone]

## 2017-07-17 ENCOUNTER — Ambulatory Visit: Payer: Medicare Other

## 2017-08-04 ENCOUNTER — Other Ambulatory Visit: Payer: Medicare Other

## 2017-08-12 ENCOUNTER — Ambulatory Visit: Payer: Medicare Other

## 2017-08-18 ENCOUNTER — Ambulatory Visit: Payer: Medicare Other

## 2017-08-20 ENCOUNTER — Encounter: Payer: Self-pay | Admitting: Urology

## 2017-08-20 ENCOUNTER — Ambulatory Visit (INDEPENDENT_AMBULATORY_CARE_PROVIDER_SITE_OTHER): Payer: Medicare Other | Admitting: Urology

## 2017-08-20 VITALS — BP 164/78 | HR 81 | Ht 70.0 in | Wt 150.8 lb

## 2017-08-20 DIAGNOSIS — R972 Elevated prostate specific antigen [PSA]: Secondary | ICD-10-CM | POA: Diagnosis not present

## 2017-08-20 DIAGNOSIS — R339 Retention of urine, unspecified: Secondary | ICD-10-CM | POA: Diagnosis not present

## 2017-08-20 DIAGNOSIS — D4122 Neoplasm of uncertain behavior of left ureter: Secondary | ICD-10-CM

## 2017-08-20 DIAGNOSIS — R350 Frequency of micturition: Secondary | ICD-10-CM

## 2017-08-20 DIAGNOSIS — D4102 Neoplasm of uncertain behavior of left kidney: Secondary | ICD-10-CM

## 2017-08-20 LAB — BLADDER SCAN AMB NON-IMAGING

## 2017-08-20 LAB — URINALYSIS, COMPLETE
Bilirubin, UA: NEGATIVE
Glucose, UA: NEGATIVE
Ketones, UA: NEGATIVE
LEUKOCYTES UA: NEGATIVE
NITRITE UA: NEGATIVE
PROTEIN UA: NEGATIVE
RBC UA: NEGATIVE
SPEC GRAV UA: 1.02 (ref 1.005–1.030)
Urobilinogen, Ur: 0.2 mg/dL (ref 0.2–1.0)
pH, UA: 7 (ref 5.0–7.5)

## 2017-08-20 MED ORDER — TAMSULOSIN HCL 0.4 MG PO CAPS: 0.4000 mg | ORAL_CAPSULE | Freq: Every day | ORAL | 11 refills | Status: DC

## 2017-08-20 NOTE — Progress Notes (Signed)
08/20/2017 10:23 AM   [place] Conner [birth year] [id number]  Referring provider: Rusty Aus, MD [facility] [place], [place] [address]  Chief Complaint  Patient presents with  . Urinary Frequency    HPI:   F/u -   1) left renal mass - underwent a noncontrast MR of the lumbar spine 03/07/2017. This revealed a solid mass left medial superior kidney on the posterior aspect just behind the hilar vessels. A follow-up noncontrast CT scan revealed similar findings 03/24/2017. The patient was scanned for low back pain and leg pain. He has had no flank pain or gross hematuria. His December 2017 BUN was 30, creatinine 1.1, GFR 63. GFR has been lower at 48-57. He has a repeat CT scan pending.   2) elevated PSA - he has never had a prostate biopsy. He has chosen a conservative approach with a PSA due October 2018 to rule out advanced prostate cancer. A May 2018 noncontrast CT showed no lymphadenopathy or obvious bony metastases. PSA hx: 8/17 7.19 6/17 5.60 6/15 3.75  3) BPH - prostate measured about 42 grams on May 2018 CT. The patient has nocturia 2-3 however this is mainly from helping his wife use the bathroom. He also has some hesitancy and weak stream. His PVR was 32. He is not on medications for this. He does not find the symptoms bothersome. He did have a TURP decades ago.  Pt returns in management of the above, but a bit early. His repeat PSA and repeat CT have not been done. He returns today because of incontinence. He felt like the was leaking without awareness starting last month. His Sep 2018 ua showed many bacteria and cx grew proteus. A f/u u/a showed a few bacteria with a negative culture and he was treated with another round of abx. UA today clear. He still feels like he has some leakage and urgency. He voids with a good flow. He has some frequency. His PVR is 537 ml.   PMH: Past Medical History:  Diagnosis Date  .  Arthritis   . BPH (benign prostatic hyperplasia) 05/03/2014  . Cataract cortical, senile   . CKD (chronic kidney disease), stage III ([place]) 05/03/2014  . Generalized OA 05/03/2014  . Glaucoma   . Hyperlipidemia   . Hyperlipidemia, unspecified 05/03/2014  . Hypertension 05/03/2014  . Hyperthyroidism   . Kidney stone 05/03/2014    Surgical History: Past Surgical History:  Procedure Laterality Date  . CATARACT EXTRACTION Right   . COLONOSCOPY  02/21/2006  . ESOPHAGOGASTRODUODENOSCOPY  02/21/2006  . JOINT REPLACEMENT Right    total hip  . kidney stone    . LITHOTRIPSY    . LUMBAR LAMINECTOMY/DECOMPRESSION MICRODISCECTOMY Right 06/04/2017   Procedure: LUMBAR LAMINECTOMY/DECOMPRESSION MICRODISCECTOMY 2 LEVELS L4-S1;  Surgeon: Meade Maw, MD;  Location: [facility] ORS;  Service: Neurosurgery;  Laterality: Right;  . TOTAL HIP ARTHROPLASTY Right     Home Medications:  Allergies as of 08/20/2017   No Known Allergies     Medication List       Accurate as of 08/20/17 10:23 AM. Always use your most recent med list.          acetaminophen 650 MG CR tablet Commonly known as:  TYLENOL Take 1,300 mg by mouth every 8 (eight) hours as needed for pain.   ASPERCREME LIDOCAINE 4 % Ptch Generic drug:  Lidocaine Apply 1 patch topically daily. Apply to right hip (area of trochanter) for pain. Remove after 12  hours.   BENICAR 40 MG tablet Generic drug:  olmesartan TAKE 1 TABLET BY MOUTH ONCE DAILY.   brimonidine 0.1 % Soln Commonly known as:  ALPHAGAN P Apply 1 drop to eye 2 (two) times daily.   budesonide-formoterol 160-4.5 MCG/ACT inhaler Commonly known as:  SYMBICORT Inhale 2 puffs into the lungs 2 (two) times daily.   celecoxib 200 MG capsule Commonly known as:  CELEBREX Take 1 capsule (200 mg total) by mouth every 12 (twelve) hours.   CEROVITE SENIOR Tabs Take 1 tablet by mouth daily.   cholecalciferol 1000 units tablet Commonly known as:  VITAMIN D Take 1,000 Units  by mouth daily.   diclofenac sodium 1 % Gel Commonly known as:  VOLTAREN Apply 2 g topically 2 (two) times daily. Apply to right hip for pain   hydroxychloroquine 200 MG tablet Commonly known as:  PLAQUENIL TAKE 1 TABLET BY MOUTH ONCE DAILY.   LUMIGAN 0.01 % Soln Generic drug:  bimatoprost Place 1 drop into both eyes at bedtime.   methocarbamol 500 MG tablet Commonly known as:  ROBAXIN Take 1 tablet (500 mg total) by mouth every 6 (six) hours as needed for muscle spasms.   metoprolol succinate 50 MG 24 hr tablet Commonly known as:  TOPROL-XL TAKE ONE TABLET BY MOUTH ONCE DAILY   montelukast 10 MG tablet Commonly known as:  SINGULAIR Take 10 mg by mouth at bedtime.   omeprazole 10 MG capsule Commonly known as:  PRILOSEC Take 10 mg by mouth daily as needed (indigestion).   oxyCODONE 5 MG immediate release tablet Commonly known as:  ROXICODONE Take 1 tablet (5 mg total) by mouth every 4 (four) hours as needed for severe pain.   simvastatin 40 MG tablet Commonly known as:  ZOCOR Take 40 mg by mouth daily at 6 PM.   SYNTHROID 50 MCG tablet Generic drug:  levothyroxine TAKE 1 TABLET BY MOUTH EVERY MORNING ON AN EMPTY STOMACH. AT LEAST 30-60 MINUTESBEFORE BREAKFAST, WITH A GLASS OF WATER   triamterene-hydrochlorothiazide 75-50 MG tablet Commonly known as:  MAXZIDE Take 0.5 tablets by mouth daily. Take 1/2 tab       Allergies: No Known Allergies  Family History: Family History  Problem Relation Age of Onset  . Coronary artery disease Mother   . Cancer Sister   . Hypertension Sister   . Prostate cancer Neg Hx     Social History:  reports that he quit smoking about 13 years ago. He has never used smokeless tobacco. He reports that he does not drink alcohol or use drugs.  ROS: UROLOGY Frequent Urination?: No Hard to postpone urination?: No Burning/pain with urination?: No Get up at night to urinate?: No Leakage of urine?: Yes Urine stream starts and stops?:  No Trouble starting stream?: No Do you have to strain to urinate?: Yes Blood in urine?: No Urinary tract infection?: No Sexually transmitted disease?: No Injury to kidneys or bladder?: No Painful intercourse?: No Weak stream?: No Erection problems?: No Penile pain?: No  Gastrointestinal Nausea?: No Vomiting?: No Indigestion/heartburn?: No Diarrhea?: No Constipation?: No  Constitutional Fever: No Night sweats?: No Weight loss?: No Fatigue?: No  Skin Skin rash/lesions?: No Itching?: No  Eyes Blurred vision?: No Double vision?: No  Ears/Nose/Throat Sore throat?: No Sinus problems?: No  Hematologic/Lymphatic Swollen glands?: No Easy bruising?: No  Cardiovascular Leg swelling?: No Chest pain?: No  Respiratory Cough?: No Shortness of breath?: No  Endocrine Excessive thirst?: No  Musculoskeletal Back pain?: No Joint pain?: No  Neurological Headaches?: No Dizziness?: No  Psychologic Depression?: No Anxiety?: No  Physical Exam: BP (!) 164/78 (BP Location: Right Arm, Patient Position: Sitting, Cuff Size: Normal)   Pulse 81   Ht 5\' 10"  (1.778 m)   Wt 68.4 kg (150 lb 12.8 oz)   BMI 21.64 kg/m   Constitutional:  Alert and oriented, No acute distress. HEENT: [place] AT, moist mucus membranes.  Trachea midline, no masses. Cardiovascular: No clubbing, cyanosis, or edema. Respiratory: Normal respiratory effort, no increased work of breathing. GI: Abdomen is soft, nontender, nondistended, no abdominal masses GU: No CVA tenderness; Normal penis and foreskin, meatus normal - dripping clear urine, testicles descended bilaterally and palpably normal. Reducible left inguinal hernia - non-tender.  CMK:[id number] ~40 grams, small nodule on left.  Skin: No rashes, bruises or suspicious lesions. Lymph: No cervical or inguinal adenopathy. Neurologic: Grossly intact, no focal deficits, moving all 4 extremities. Psychiatric: Normal mood and affect.  Laboratory Data: Lab  Results  Component Value Date   WBC 12.1 (H) 06/10/2017   HGB 14.6 06/10/2017   HCT 42.9 06/10/2017   MCV 98.3 06/10/2017   PLT 204 06/10/2017    Lab Results  Component Value Date   CREATININE 0.99 06/10/2017    No results found for: PSA1  No results found for: TESTOSTERONE  No results found for: HGBA1C  Urinalysis No results found for: SPECGRAV, PHUR, COLORU, APPEARANCEUR, LEUKOCYTESUR, PROTEINUR, GLUCOSEU, KETONESU, RBCU, BILIRUBINUR, UUROB, NITRITE  No results found for: LABMICR, WBCUA, RBCUA, LABEPIT, MUCUS, BACTERIA  Pertinent Imaging: CT No results found for this or any previous visit. No results found for this or any previous visit. No results found for this or any previous visit. No results found for this or any previous visit. No results found for this or any previous visit. No results found for this or any previous visit. No results found for this or any previous visit. No results found for this or any previous visit.  Assessment & Plan:    1. Micturition frequency and overflow incontinence -  Likely from incomplete bladder emptying. Start tamsulosin. Recheck PVR.  - Urinalysis, Complete  2. PSA - UTI could certainly falsely elevate, but his UA is clear today. Check PSA prior to follow-up. Landmarks preserved on exam, but he does have a left nodule.   3. Renal mass - schedule CT as planned.     No Follow-up on file.  Festus Aloe, [facility] [address], [place], [place] [address] [phone]

## 2017-09-04 ENCOUNTER — Other Ambulatory Visit: Payer: Medicare Other

## 2017-09-04 DIAGNOSIS — R972 Elevated prostate specific antigen [PSA]: Secondary | ICD-10-CM

## 2017-09-05 ENCOUNTER — Ambulatory Visit
Admission: RE | Admit: 2017-09-05 | Discharge: 2017-09-05 | Disposition: A | Discharge status: Home / Self Care | Payer: Medicare Other | Source: Ambulatory Visit | Attending: Urology | Admitting: Urology

## 2017-09-05 DIAGNOSIS — D4122 Neoplasm of uncertain behavior of left ureter: Secondary | ICD-10-CM | POA: Insufficient documentation

## 2017-09-05 DIAGNOSIS — N1330 Unspecified hydronephrosis: Secondary | ICD-10-CM | POA: Diagnosis not present

## 2017-09-05 DIAGNOSIS — D4102 Neoplasm of uncertain behavior of left kidney: Secondary | ICD-10-CM | POA: Diagnosis not present

## 2017-09-05 DIAGNOSIS — N134 Hydroureter: Secondary | ICD-10-CM | POA: Diagnosis not present

## 2017-09-05 HISTORY — DX: Unspecified asthma, uncomplicated: J45.909

## 2017-09-05 LAB — PSA, TOTAL AND FREE
PSA FREE PCT: 21.5 %
PSA FREE: 1.31 ng/mL
Prostate Specific Ag, Serum: 6.1 ng/mL — ABNORMAL HIGH (ref 0.0–4.0)

## 2017-09-05 LAB — POCT I-STAT CREATININE: Creatinine, Ser: 1 mg/dL (ref 0.61–1.24)

## 2017-09-05 MED ORDER — IOPAMIDOL (ISOVUE-300) INJECTION 61%
100.0000 mL | Freq: Once | INTRAVENOUS | Status: AC | PRN
  Administered 2017-09-05: 100 mL via INTRAVENOUS

## 2017-09-09 ENCOUNTER — Encounter: Payer: Self-pay | Admitting: Urology

## 2017-09-09 ENCOUNTER — Ambulatory Visit (INDEPENDENT_AMBULATORY_CARE_PROVIDER_SITE_OTHER): Payer: Medicare Other | Admitting: Urology

## 2017-09-09 VITALS — BP 161/81 | HR 79 | Ht 70.0 in | Wt 149.5 lb

## 2017-09-09 DIAGNOSIS — N2889 Other specified disorders of kidney and ureter: Secondary | ICD-10-CM

## 2017-09-09 DIAGNOSIS — R972 Elevated prostate specific antigen [PSA]: Secondary | ICD-10-CM | POA: Diagnosis not present

## 2017-09-09 DIAGNOSIS — N401 Enlarged prostate with lower urinary tract symptoms: Secondary | ICD-10-CM

## 2017-09-09 DIAGNOSIS — N138 Other obstructive and reflux uropathy: Secondary | ICD-10-CM

## 2017-09-09 NOTE — Progress Notes (Signed)
09/09/2017 10:51 AM   Logan Conner 03/19/1925 [id number]  Referring provider: Rusty Aus, MD [facility] [place], [place] [address]  No chief complaint on file.   HPI:  F/u -   1) left renal mass - underwent a noncontrast MR of the lumbar spine 03/07/2017. This revealed a solid 2.9 cm mass left medial superior kidney on the posterior aspect surrounded by hilar vessels. A follow-up noncontrast CT scan revealed similar findings 03/24/2017 with a 2.8 cm mass. The patient was scanned for low back pain and leg pain. He has had no flank pain or gross hematuria. His December 2017 BUN was 30, creatinine 1.1, GFR 63. GFR has been lower at 48-57.  2) elevated PSA - he has never had a prostate biopsy. He has chosen a conservative approach with a PSA due October 2018 to rule out advanced prostate cancer. A May 2018 noncontrast CT showed no lymphadenopathy or obvious bony metastases. PSA hx: 08/2017  6.1, DRE - small left nodule  06/2016 7.19 04/2016 5.60 04/2014 3.75  3) BPH - prostate measured about 42 grams on May 2018 CT. The patient has nocturia 2-3 however this is mainly from helping his wife use the bathroom. He also has some hesitancy and weak stream. His PVR was 32. He did have a TURP decades ago. Incontinence and a PVR of 537 Oct 2018. Added tamsulosin.   Pt returns in management of the above. His PSA remains stable, lower at 6.1. He is voiding with a better flow and feels a little more empty on tamsulosin. Still some incontinence. He underwent repeat CT and the left renal mass may be slightly larger at 3.5 cm.     PMH: Past Medical History:  Diagnosis Date  . Arthritis   . Asthma   . BPH (benign prostatic hyperplasia) 05/03/2014  . Cataract cortical, senile   . CKD (chronic kidney disease), stage III ([place]) 05/03/2014  . Generalized OA 05/03/2014  . Glaucoma   . Hyperlipidemia   . Hyperlipidemia, unspecified 05/03/2014   . Hypertension 05/03/2014  . Hyperthyroidism   . Kidney stone 05/03/2014    Surgical History: Past Surgical History:  Procedure Laterality Date  . CATARACT EXTRACTION Right   . COLONOSCOPY  02/21/2006  . ESOPHAGOGASTRODUODENOSCOPY  02/21/2006  . JOINT REPLACEMENT Right    total hip  . kidney stone    . LITHOTRIPSY    . LUMBAR LAMINECTOMY/DECOMPRESSION MICRODISCECTOMY Right 06/04/2017   Procedure: LUMBAR LAMINECTOMY/DECOMPRESSION MICRODISCECTOMY 2 LEVELS L4-S1;  Surgeon: Meade Maw, MD;  Location: [facility] ORS;  Service: Neurosurgery;  Laterality: Right;  . TOTAL HIP ARTHROPLASTY Right     Home Medications:  Allergies as of 09/09/2017   No Known Allergies     Medication List       Accurate as of 09/09/17 10:51 AM. Always use your most recent med list.          acetaminophen 650 MG CR tablet Commonly known as:  TYLENOL Take 1,300 mg by mouth every 8 (eight) hours as needed for pain.   ASPERCREME LIDOCAINE 4 % Ptch Generic drug:  Lidocaine Apply 1 patch topically daily. Apply to right hip (area of trochanter) for pain. Remove after 12 hours.   BENICAR 40 MG tablet Generic drug:  olmesartan TAKE 1 TABLET BY MOUTH ONCE DAILY.   brimonidine 0.1 % Soln Commonly known as:  ALPHAGAN P Apply 1 drop to eye 2 (two) times daily.   budesonide-formoterol 160-4.5 MCG/ACT inhaler Commonly  known as:  SYMBICORT Inhale 2 puffs into the lungs 2 (two) times daily.   celecoxib 200 MG capsule Commonly known as:  CELEBREX Take 1 capsule (200 mg total) by mouth every 12 (twelve) hours.   CEROVITE SENIOR Tabs Take 1 tablet by mouth daily.   cholecalciferol 1000 units tablet Commonly known as:  VITAMIN D Take 1,000 Units by mouth daily.   hydroxychloroquine 200 MG tablet Commonly known as:  PLAQUENIL TAKE 1 TABLET BY MOUTH ONCE DAILY.   LUMIGAN 0.01 % Soln Generic drug:  bimatoprost Place 1 drop into both eyes at bedtime.   methocarbamol 500 MG tablet Commonly known  as:  ROBAXIN Take 1 tablet (500 mg total) by mouth every 6 (six) hours as needed for muscle spasms.   metoprolol succinate 50 MG 24 hr tablet Commonly known as:  TOPROL-XL TAKE ONE TABLET BY MOUTH ONCE DAILY   montelukast 10 MG tablet Commonly known as:  SINGULAIR Take 10 mg by mouth at bedtime.   omeprazole 10 MG capsule Commonly known as:  PRILOSEC Take 10 mg by mouth daily as needed (indigestion).   oxyCODONE 5 MG immediate release tablet Commonly known as:  ROXICODONE Take 1 tablet (5 mg total) by mouth every 4 (four) hours as needed for severe pain.   simvastatin 40 MG tablet Commonly known as:  ZOCOR Take 40 mg by mouth daily at 6 PM.   SYNTHROID 50 MCG tablet Generic drug:  levothyroxine TAKE 1 TABLET BY MOUTH EVERY MORNING ON AN EMPTY STOMACH. AT LEAST 30-60 MINUTESBEFORE BREAKFAST, WITH A GLASS OF WATER   tamsulosin 0.4 MG Caps capsule Commonly known as:  FLOMAX Take 1 capsule (0.4 mg total) by mouth daily after supper.   triamterene-hydrochlorothiazide 75-50 MG tablet Commonly known as:  MAXZIDE Take 0.5 tablets by mouth daily. Take 1/2 tab       Allergies: No Known Allergies  Family History: Family History  Problem Relation Age of Onset  . Coronary artery disease Mother   . Cancer Sister   . Hypertension Sister   . Prostate cancer Neg Hx     Social History:  reports that he quit smoking about 13 years ago. He has never used smokeless tobacco. He reports that he does not drink alcohol or use drugs.  ROS:                                        Physical Exam: There were no vitals taken for this visit.  Constitutional:  Alert and oriented, No acute distress. HEENT: [place] AT, moist mucus membranes.  Trachea midline, no masses. Cardiovascular: No clubbing, cyanosis, or edema. Respiratory: Normal respiratory effort, no increased work of breathing. GI: Abdomen is soft, nontender, nondistended, no abdominal masses GU: No CVA  tenderness.  Skin: No rashes, bruises or suspicious lesions. Neurologic: Grossly intact, no focal deficits, moving all 4 extremities. Psychiatric: Normal mood and affect.  Laboratory Data: Lab Results  Component Value Date   WBC 12.1 (H) 06/10/2017   HGB 14.6 06/10/2017   HCT 42.9 06/10/2017   MCV 98.3 06/10/2017   PLT 204 06/10/2017    Lab Results  Component Value Date   CREATININE 1.00 09/05/2017    Lab Results  Component Value Date   PSA1 6.1 (H) 09/04/2017    No results found for: TESTOSTERONE  No results found for: HGBA1C  Urinalysis Lab Results  Component Value Date  SPECGRAV 1.020 08/20/2017   PHUR 7.0 08/20/2017   COLORU Yellow 08/20/2017   APPEARANCEUR Clear 08/20/2017   LEUKOCYTESUR Negative 08/20/2017   PROTEINUR Negative 08/20/2017   GLUCOSEU Negative 08/20/2017   KETONESU Negative 08/20/2017   RBCU Negative 08/20/2017   BILIRUBINUR Negative 08/20/2017   UUROB 0.2 08/20/2017   NITRITE Negative 08/20/2017    No results found for: LABMICR, [place], RBCUA, LABEPIT, MUCUS, BACTERIA  Pertinent Imaging: CT x 2 and MRI  No results found for this or any previous visit. No results found for this or any previous visit. No results found for this or any previous visit. No results found for this or any previous visit. No results found for this or any previous visit. No results found for this or any previous visit. No results found for this or any previous visit. No results found for this or any previous visit.  Assessment & Plan:   Left renal mass-we discussed slight growth of the mass.  We discussed again the nature risk and benefits of surveillance, biopsy, ablation, partial radical nephrectomy.  We discussed risk of surveillance such as metastasis and renal mass progressing from curable to incurable.  We discussed this can be life-threatening but there are some medicines that can control metastatic kidney cancer.  We will see him in 6 months with a CT scan  prior.  The mass is medial and surrounded by vessels.  BPH with lower urinary tract symptoms, incontinence-we discussed proceeding with cystoscopy but he would like to continue tamsulosin for now.  Elevated PSA-PSA lower and stable.  Will monitor.  See in 6 months with a CT scan prior to evaluate the left renal mass.  Consider PSA or in 1 year.    There are no diagnoses linked to this encounter.  No Follow-up on file.  Festus Aloe, [facility] [address], [place], [place] [address] [phone]

## 2018-03-11 ENCOUNTER — Ambulatory Visit
Admission: RE | Admit: 2018-03-11 | Discharge: 2018-03-11 | Disposition: A | Discharge status: Home / Self Care | Payer: Medicare Other | Source: Ambulatory Visit | Attending: Urology | Admitting: Urology

## 2018-03-11 DIAGNOSIS — Q628 Other congenital malformations of ureter: Secondary | ICD-10-CM | POA: Diagnosis not present

## 2018-03-11 DIAGNOSIS — N281 Cyst of kidney, acquired: Secondary | ICD-10-CM | POA: Diagnosis not present

## 2018-03-11 DIAGNOSIS — K8020 Calculus of gallbladder without cholecystitis without obstruction: Secondary | ICD-10-CM | POA: Insufficient documentation

## 2018-03-11 DIAGNOSIS — N200 Calculus of kidney: Secondary | ICD-10-CM | POA: Diagnosis not present

## 2018-03-11 DIAGNOSIS — I700 Atherosclerosis of aorta: Secondary | ICD-10-CM | POA: Insufficient documentation

## 2018-03-11 DIAGNOSIS — N2889 Other specified disorders of kidney and ureter: Secondary | ICD-10-CM | POA: Diagnosis present

## 2018-03-11 HISTORY — DX: Systemic involvement of connective tissue, unspecified: M35.9

## 2018-03-11 LAB — POCT I-STAT CREATININE: CREATININE: 1 mg/dL (ref 0.61–1.24)

## 2018-03-11 MED ORDER — IOPAMIDOL (ISOVUE-300) INJECTION 61%
100.0000 mL | Freq: Once | INTRAVENOUS | Status: AC | PRN
  Administered 2018-03-11: 100 mL via INTRAVENOUS

## 2018-03-16 ENCOUNTER — Encounter: Payer: Self-pay | Admitting: Urology

## 2018-03-16 ENCOUNTER — Ambulatory Visit: Payer: Medicare Other | Admitting: Urology

## 2018-03-16 VITALS — BP 137/76 | HR 102 | Ht 70.0 in | Wt 149.7 lb

## 2018-03-16 DIAGNOSIS — D4102 Neoplasm of uncertain behavior of left kidney: Secondary | ICD-10-CM | POA: Diagnosis not present

## 2018-03-16 DIAGNOSIS — R972 Elevated prostate specific antigen [PSA]: Secondary | ICD-10-CM | POA: Diagnosis not present

## 2018-03-16 DIAGNOSIS — N2889 Other specified disorders of kidney and ureter: Secondary | ICD-10-CM

## 2018-03-16 DIAGNOSIS — D4122 Neoplasm of uncertain behavior of left ureter: Secondary | ICD-10-CM

## 2018-03-16 DIAGNOSIS — N138 Other obstructive and reflux uropathy: Secondary | ICD-10-CM

## 2018-03-16 DIAGNOSIS — R3912 Poor urinary stream: Secondary | ICD-10-CM

## 2018-03-16 DIAGNOSIS — N401 Enlarged prostate with lower urinary tract symptoms: Secondary | ICD-10-CM

## 2018-03-16 MED ORDER — FINASTERIDE 5 MG PO TABS: 5.0000 mg | ORAL_TABLET | Freq: Every day | ORAL | 3 refills | Status: DC

## 2018-03-16 NOTE — Progress Notes (Signed)
03/16/2018 10:53 AM   Logan Conner 1925-03-27 [id number]  Referring provider: Rusty Aus, MD [facility] [place], [place] [address]  Chief Complaint  Patient presents with  . Follow-up    Ct results    HPI: F/u -   1) left renal mass - noncontrast MR of the lumbar spine 03/07/2017 revealed a solid 2.9 cm mass left medial superior kidney on the posterior aspect surrounded by hilar vessels. A follow-up noncontrast CT scan revealed similar findings 03/24/2017 with a 2.8 cm mass. The patient was scanned for low back pain and leg pain. He has had no flank pain or gross hematuria. His December 2017 BUN was 30, creatinine 1.1, GFR 63. GFR has been lower at 48-57. Repeat CT Oct 2018 revealed the left renal mass may be slightly larger at 3.5 cm x 3.1.   2) elevated PSA - he has never had a prostate biopsy. He has chosen a [employer] approach. Staging for renal mass without signs of mets - LAD or bone lesions.  PSA hx: 08/2017  6.1, DRE - small left nodule  06/2016 7.19 04/2016 5.60 04/2014 3.75  3) BPH - prostate measured about 42 grams on May 2018 CT. The patient has nocturia 2-3 however this is mainly from helping his wife use the bathroom. He also has some hesitancy and weak stream. His PVR was 32. He did have a TURP decades ago. Incontinence and a PVR of 537 Oct 2018. Added tamsulosin which improved symptoms.   Patient returns in management of BPH and renal mass.  His voiding is stable with occasional frequency, nocturia but continued hesitancy and straining.  He feels like he is emptying his bladder, but slowly.  No gross hematuria.  He underwent repeat CT scan of the abdomen Mar 11, 2018 which showed a stable left renal mass at about 3.4 cm x 2.8 cm. Creatinine was 1.      PMH: Past Medical History:  Diagnosis Date  . Arthritis   . Asthma   . BPH (benign prostatic hyperplasia) 05/03/2014  . Cataract cortical, senile   . CKD  (chronic kidney disease), stage III ([place]) 05/03/2014  . Collagen vascular disease (HCC)    RA  . Generalized OA 05/03/2014  . Glaucoma   . Hyperlipidemia   . Hyperlipidemia, unspecified 05/03/2014  . Hypertension 05/03/2014  . Hyperthyroidism   . Kidney stone 05/03/2014    Surgical History: Past Surgical History:  Procedure Laterality Date  . CATARACT EXTRACTION Right   . COLONOSCOPY  02/21/2006  . ESOPHAGOGASTRODUODENOSCOPY  02/21/2006  . JOINT REPLACEMENT Right    total hip  . kidney stone    . LITHOTRIPSY    . LUMBAR LAMINECTOMY/DECOMPRESSION MICRODISCECTOMY Right 06/04/2017   Procedure: LUMBAR LAMINECTOMY/DECOMPRESSION MICRODISCECTOMY 2 LEVELS L4-S1;  Surgeon: Meade Maw, MD;  Location: [facility] ORS;  Service: Neurosurgery;  Laterality: Right;  . TOTAL HIP ARTHROPLASTY Right     Home Medications:  Allergies as of 03/16/2018   No Known Allergies     Medication List        Accurate as of 03/16/18 10:53 AM. Always use your most recent med list.          acetaminophen 650 MG CR tablet Commonly known as:  TYLENOL Take 1,300 mg by mouth every 8 (eight) hours as needed for pain.   ASPERCREME LIDOCAINE 4 % Ptch Generic drug:  Lidocaine Apply 1 patch topically daily. Apply to right hip (area of trochanter) for pain.  Remove after 12 hours.   BENICAR 40 MG tablet Generic drug:  olmesartan TAKE 1 TABLET BY MOUTH ONCE DAILY.   brimonidine 0.1 % Soln Commonly known as:  ALPHAGAN P Apply 1 drop to eye 2 (two) times daily.   budesonide-formoterol 160-4.5 MCG/ACT inhaler Commonly known as:  SYMBICORT Inhale 2 puffs into the lungs 2 (two) times daily.   celecoxib 200 MG capsule Commonly known as:  CELEBREX Take 1 capsule (200 mg total) by mouth every 12 (twelve) hours.   CEROVITE SENIOR Tabs Take 1 tablet by mouth daily.   cholecalciferol 1000 units tablet Commonly known as:  VITAMIN D Take 1,000 Units by mouth daily.   hydroxychloroquine 200 MG  tablet Commonly known as:  PLAQUENIL TAKE 1 TABLET BY MOUTH ONCE DAILY.   LUMIGAN 0.01 % Soln Generic drug:  bimatoprost Place 1 drop into both eyes at bedtime.   methocarbamol 500 MG tablet Commonly known as:  ROBAXIN Take 1 tablet (500 mg total) by mouth every 6 (six) hours as needed for muscle spasms.   metoprolol succinate 50 MG 24 hr tablet Commonly known as:  TOPROL-XL TAKE ONE TABLET BY MOUTH ONCE DAILY   montelukast 10 MG tablet Commonly known as:  SINGULAIR Take 10 mg by mouth at bedtime.   omeprazole 10 MG capsule Commonly known as:  PRILOSEC Take 10 mg by mouth daily as needed (indigestion).   simvastatin 40 MG tablet Commonly known as:  ZOCOR Take 40 mg by mouth daily at 6 PM.   SYNTHROID 50 MCG tablet Generic drug:  levothyroxine TAKE 1 TABLET BY MOUTH EVERY MORNING ON AN EMPTY STOMACH. AT LEAST 30-60 MINUTESBEFORE BREAKFAST, WITH A GLASS OF WATER   tamsulosin 0.4 MG Caps capsule Commonly known as:  FLOMAX Take 1 capsule (0.4 mg total) by mouth daily after supper.   triamterene-hydrochlorothiazide 75-50 MG tablet Commonly known as:  MAXZIDE Take 0.5 tablets by mouth daily. Take 1/2 tab       Allergies: No Known Allergies  Family History: Family History  Problem Relation Age of Onset  . Coronary artery disease Mother   . Cancer Sister   . Hypertension Sister   . Prostate cancer Neg Hx     Social History:  reports that he quit smoking about 13 years ago. He has never used smokeless tobacco. He reports that he does not drink alcohol or use drugs.  ROS: UROLOGY Frequent Urination?: Yes Hard to postpone urination?: No Burning/pain with urination?: No Get up at night to urinate?: Yes Leakage of urine?: Yes Urine stream starts and stops?: No Trouble starting stream?: Yes Do you have to strain to urinate?: Yes Blood in urine?: No Urinary tract infection?: No Sexually transmitted disease?: No Injury to kidneys or bladder?: No Painful  intercourse?: No Weak stream?: Yes Erection problems?: No Penile pain?: No  Gastrointestinal Nausea?: No Vomiting?: No Indigestion/heartburn?: No Diarrhea?: No Constipation?: No  Constitutional Fever: No Night sweats?: No Weight loss?: No Fatigue?: No  Skin Skin rash/lesions?: No Itching?: No  Eyes Blurred vision?: No Double vision?: No  Ears/Nose/Throat Sore throat?: No Sinus problems?: No  Hematologic/Lymphatic Swollen glands?: No Easy bruising?: No  Cardiovascular Leg swelling?: No Chest pain?: No  Respiratory Cough?: No Shortness of breath?: No  Endocrine Excessive thirst?: No  Musculoskeletal Back pain?: No Joint pain?: No  Neurological Headaches?: No Dizziness?: No  Psychologic Depression?: No Anxiety?: No  Physical Exam: BP 137/76 (BP Location: Left Arm, Patient Position: Sitting, Cuff Size: Normal)   Pulse Marland Kitchen)  102   Ht 5\' 10"  (1.778 m)   Wt 67.9 kg (149 lb 11.2 oz)   BMI 21.48 kg/m   Constitutional:  Alert and oriented, No acute distress. HEENT: [place] AT, moist mucus membranes.  Trachea midline, no masses. Cardiovascular: No clubbing, cyanosis, or edema. Respiratory: Normal respiratory effort, no increased work of breathing. GI: Abdomen is soft, nontender, nondistended, no abdominal masses GU: No CVA tenderness Skin: No rashes, bruises or suspicious lesions. Neurologic: Grossly intact, no focal deficits, moving all 4 extremities. Psychiatric: Normal mood and affect.  Laboratory Data: Lab Results  Component Value Date   WBC 12.1 (H) 06/10/2017   HGB 14.6 06/10/2017   HCT 42.9 06/10/2017   MCV 98.3 06/10/2017   PLT 204 06/10/2017    Lab Results  Component Value Date   CREATININE 1.00 03/11/2018    No results found for: PSA  No results found for: TESTOSTERONE  No results found for: HGBA1C  Urinalysis    Component Value Date/Time   APPEARANCEUR Clear 08/20/2017 1006   GLUCOSEU Negative 08/20/2017 1006   BILIRUBINUR  Negative 08/20/2017 1006   PROTEINUR Negative 08/20/2017 1006   NITRITE Negative 08/20/2017 1006   LEUKOCYTESUR Negative 08/20/2017 1006    No results found for: LABMICR, [place], RBCUA, LABEPIT, MUCUS, BACTERIA  Pertinent Imaging: CT images reviewed - apr 2019  No results found for this or any previous visit. No results found for this or any previous visit. No results found for this or any previous visit. No results found for this or any previous visit. No results found for this or any previous visit. No results found for this or any previous visit. No results found for this or any previous visit. No results found for this or any previous visit.  Assessment & Plan:    Renal mass - stable - check CT in 6 months and consider yearly if stable   BPH, luts - on tamsulosin. Discussed nature r/b of adding 5ari. Discussed fda warnings. Add finasteride. Check pvr next time.   PSA - check in 6 mo   No follow-ups on file.  Festus Aloe, MD  [facility] [address], [place], [place] [address] [phone]

## 2018-06-02 ENCOUNTER — Emergency Department (HOSPITAL_COMMUNITY): Payer: Medicare Other | Admitting: Certified Registered"

## 2018-06-02 ENCOUNTER — Emergency Department (HOSPITAL_COMMUNITY): Payer: Medicare Other

## 2018-06-02 ENCOUNTER — Encounter (HOSPITAL_COMMUNITY): Payer: Self-pay | Admitting: *Deleted

## 2018-06-02 ENCOUNTER — Other Ambulatory Visit: Payer: Self-pay

## 2018-06-02 ENCOUNTER — Encounter (HOSPITAL_COMMUNITY)
Admission: EM | Disposition: A | Discharge status: Hospice | Payer: Self-pay | Source: Home / Self Care | Attending: Neurology

## 2018-06-02 ENCOUNTER — Inpatient Hospital Stay (HOSPITAL_COMMUNITY)
Admission: EM | Admit: 2018-06-02 | Discharge: 2018-06-05 | DRG: 024 | Disposition: A | Discharge status: Hospice | Payer: Medicare Other | Attending: Neurology | Admitting: Neurology

## 2018-06-02 DIAGNOSIS — E785 Hyperlipidemia, unspecified: Secondary | ICD-10-CM | POA: Diagnosis present

## 2018-06-02 DIAGNOSIS — I63512 Cerebral infarction due to unspecified occlusion or stenosis of left middle cerebral artery: Secondary | ICD-10-CM | POA: Diagnosis not present

## 2018-06-02 DIAGNOSIS — I129 Hypertensive chronic kidney disease with stage 1 through stage 4 chronic kidney disease, or unspecified chronic kidney disease: Secondary | ICD-10-CM | POA: Diagnosis not present

## 2018-06-02 DIAGNOSIS — Z96641 Presence of right artificial hip joint: Secondary | ICD-10-CM | POA: Diagnosis present

## 2018-06-02 DIAGNOSIS — Z79890 Hormone replacement therapy (postmenopausal): Secondary | ICD-10-CM | POA: Diagnosis not present

## 2018-06-02 DIAGNOSIS — I639 Cerebral infarction, unspecified: Secondary | ICD-10-CM | POA: Diagnosis present

## 2018-06-02 DIAGNOSIS — J45909 Unspecified asthma, uncomplicated: Secondary | ICD-10-CM | POA: Diagnosis present

## 2018-06-02 DIAGNOSIS — M159 Polyosteoarthritis, unspecified: Secondary | ICD-10-CM | POA: Diagnosis present

## 2018-06-02 DIAGNOSIS — R1310 Dysphagia, unspecified: Secondary | ICD-10-CM | POA: Diagnosis present

## 2018-06-02 DIAGNOSIS — I6602 Occlusion and stenosis of left middle cerebral artery: Secondary | ICD-10-CM | POA: Diagnosis present

## 2018-06-02 DIAGNOSIS — Z87891 Personal history of nicotine dependence: Secondary | ICD-10-CM

## 2018-06-02 DIAGNOSIS — N400 Enlarged prostate without lower urinary tract symptoms: Secondary | ICD-10-CM | POA: Diagnosis present

## 2018-06-02 DIAGNOSIS — Z66 Do not resuscitate: Secondary | ICD-10-CM | POA: Diagnosis not present

## 2018-06-02 DIAGNOSIS — Z8249 Family history of ischemic heart disease and other diseases of the circulatory system: Secondary | ICD-10-CM

## 2018-06-02 DIAGNOSIS — Z79899 Other long term (current) drug therapy: Secondary | ICD-10-CM

## 2018-06-02 DIAGNOSIS — H5347 Heteronymous bilateral field defects: Secondary | ICD-10-CM | POA: Diagnosis not present

## 2018-06-02 DIAGNOSIS — M069 Rheumatoid arthritis, unspecified: Secondary | ICD-10-CM | POA: Diagnosis present

## 2018-06-02 DIAGNOSIS — G8191 Hemiplegia, unspecified affecting right dominant side: Secondary | ICD-10-CM | POA: Diagnosis not present

## 2018-06-02 DIAGNOSIS — R4701 Aphasia: Secondary | ICD-10-CM | POA: Diagnosis not present

## 2018-06-02 DIAGNOSIS — N183 Chronic kidney disease, stage 3 (moderate): Secondary | ICD-10-CM | POA: Diagnosis not present

## 2018-06-02 DIAGNOSIS — H409 Unspecified glaucoma: Secondary | ICD-10-CM | POA: Diagnosis present

## 2018-06-02 DIAGNOSIS — Z7951 Long term (current) use of inhaled steroids: Secondary | ICD-10-CM

## 2018-06-02 DIAGNOSIS — I63412 Cerebral infarction due to embolism of left middle cerebral artery: Principal | ICD-10-CM | POA: Diagnosis present

## 2018-06-02 DIAGNOSIS — Z515 Encounter for palliative care: Secondary | ICD-10-CM | POA: Diagnosis not present

## 2018-06-02 DIAGNOSIS — Z9841 Cataract extraction status, right eye: Secondary | ICD-10-CM

## 2018-06-02 DIAGNOSIS — H25019 Cortical age-related cataract, unspecified eye: Secondary | ICD-10-CM | POA: Diagnosis present

## 2018-06-02 DIAGNOSIS — R29728 NIHSS score 28: Secondary | ICD-10-CM | POA: Diagnosis present

## 2018-06-02 DIAGNOSIS — I6349 Cerebral infarction due to embolism of other cerebral artery: Secondary | ICD-10-CM | POA: Diagnosis present

## 2018-06-02 DIAGNOSIS — L8990 Pressure ulcer of unspecified site, unspecified stage: Secondary | ICD-10-CM

## 2018-06-02 DIAGNOSIS — Z87442 Personal history of urinary calculi: Secondary | ICD-10-CM

## 2018-06-02 DIAGNOSIS — I361 Nonrheumatic tricuspid (valve) insufficiency: Secondary | ICD-10-CM | POA: Diagnosis not present

## 2018-06-02 DIAGNOSIS — R29810 Facial weakness: Secondary | ICD-10-CM | POA: Diagnosis present

## 2018-06-02 HISTORY — PX: IR PERCUTANEOUS ART THROMBECTOMY/INFUSION INTRACRANIAL INC DIAG ANGIO: IMG6087

## 2018-06-02 HISTORY — PX: IR CT HEAD LTD: IMG2386

## 2018-06-02 HISTORY — PX: RADIOLOGY WITH ANESTHESIA: SHX6223

## 2018-06-02 LAB — I-STAT TROPONIN, ED: TROPONIN I, POC: 0.05 ng/mL (ref 0.00–0.08)

## 2018-06-02 LAB — COMPREHENSIVE METABOLIC PANEL
ALT: 13 U/L (ref 0–44)
ANION GAP: 8 (ref 5–15)
AST: 16 U/L (ref 15–41)
Albumin: 3.7 g/dL (ref 3.5–5.0)
Alkaline Phosphatase: 63 U/L (ref 38–126)
BILIRUBIN TOTAL: 0.6 mg/dL (ref 0.3–1.2)
BUN: 24 mg/dL — ABNORMAL HIGH (ref 8–23)
CO2: 24 mmol/L (ref 22–32)
Calcium: 9.2 mg/dL (ref 8.9–10.3)
Chloride: 110 mmol/L (ref 98–111)
Creatinine, Ser: 1.11 mg/dL (ref 0.61–1.24)
GFR, EST NON AFRICAN AMERICAN: 56 mL/min — AB (ref >60)
Glucose, Bld: 135 mg/dL — ABNORMAL HIGH (ref 70–99)
POTASSIUM: 3.8 mmol/L (ref 3.5–5.1)
Sodium: 142 mmol/L (ref 135–145)
TOTAL PROTEIN: 5.8 g/dL — AB (ref 6.5–8.1)

## 2018-06-02 LAB — I-STAT CHEM 8, ED
BUN: 27 mg/dL — AB (ref 8–23)
CALCIUM ION: 1.14 mmol/L — AB (ref 1.15–1.40)
Chloride: 109 mmol/L (ref 98–111)
Creatinine, Ser: 1.2 mg/dL (ref 0.61–1.24)
Glucose, Bld: 130 mg/dL — ABNORMAL HIGH (ref 70–99)
HCT: 36 % — ABNORMAL LOW (ref 39.0–52.0)
HEMOGLOBIN: 12.2 g/dL — AB (ref 13.0–17.0)
Potassium: 3.7 mmol/L (ref 3.5–5.1)
SODIUM: 144 mmol/L (ref 135–145)
TCO2: 23 mmol/L (ref 22–32)

## 2018-06-02 LAB — DIFFERENTIAL
Abs Immature Granulocytes: 0 10*3/uL (ref 0.0–0.1)
BASOS PCT: 0 %
Basophils Absolute: 0 10*3/uL (ref 0.0–0.1)
EOS ABS: 0.2 10*3/uL (ref 0.0–0.7)
EOS PCT: 2 %
Immature Granulocytes: 0 %
LYMPHS ABS: 1.8 10*3/uL (ref 0.7–4.0)
Lymphocytes Relative: 22 %
MONOS PCT: 10 %
Monocytes Absolute: 0.8 10*3/uL (ref 0.1–1.0)
NEUTROS PCT: 66 %
Neutro Abs: 5.3 10*3/uL (ref 1.7–7.7)

## 2018-06-02 LAB — CBC
HCT: 41.7 % (ref 39.0–52.0)
Hemoglobin: 13.1 g/dL (ref 13.0–17.0)
MCH: 32.1 pg (ref 26.0–34.0)
MCHC: 31.4 g/dL (ref 30.0–36.0)
MCV: 102.2 fL — AB (ref 78.0–100.0)
PLATELETS: 155 10*3/uL (ref 150–400)
RBC: 4.08 MIL/uL — ABNORMAL LOW (ref 4.22–5.81)
RDW: 13.4 % (ref 11.5–15.5)
WBC: 8.2 10*3/uL (ref 4.0–10.5)

## 2018-06-02 LAB — APTT: aPTT: 34 seconds (ref 24–36)

## 2018-06-02 LAB — CBG MONITORING, ED: GLUCOSE-CAPILLARY: 126 mg/dL — AB (ref 70–99)

## 2018-06-02 LAB — MRSA PCR SCREENING: MRSA by PCR: NEGATIVE

## 2018-06-02 LAB — PROTIME-INR
INR: 1.08
Prothrombin Time: 13.9 seconds (ref 11.4–15.2)

## 2018-06-02 SURGERY — RADIOLOGY WITH ANESTHESIA
Anesthesia: General

## 2018-06-02 MED ORDER — ACETAMINOPHEN 650 MG RE SUPP: 650.0000 mg | RECTAL | Status: DC | PRN

## 2018-06-02 MED ORDER — SODIUM CHLORIDE 0.9 % IV SOLN
INTRAVENOUS | Status: DC
  Administered 2018-06-03 (×2): via INTRAVENOUS

## 2018-06-02 MED ORDER — EPTIFIBATIDE 20 MG/10ML IV SOLN
INTRAVENOUS | Status: AC
  Filled 2018-06-02: qty 10

## 2018-06-02 MED ORDER — STROKE: EARLY STAGES OF RECOVERY BOOK
Freq: Once | Status: AC
  Administered 2018-06-02: 1
  Filled 2018-06-02 (×2): qty 1

## 2018-06-02 MED ORDER — CLEVIDIPINE BUTYRATE 0.5 MG/ML IV EMUL
0.0000 mg/h | INTRAVENOUS | Status: DC
  Administered 2018-06-02: 9 mg/h via INTRAVENOUS
  Administered 2018-06-03: 20 mg/h via INTRAVENOUS
  Administered 2018-06-03: 12 mg/h via INTRAVENOUS
  Administered 2018-06-03: 20 mg/h via INTRAVENOUS
  Administered 2018-06-03: 21 mg/h via INTRAVENOUS
  Administered 2018-06-03: 20 mg/h via INTRAVENOUS
  Administered 2018-06-03: 11 mg/h via INTRAVENOUS
  Filled 2018-06-02 (×8): qty 50

## 2018-06-02 MED ORDER — TICAGRELOR 90 MG PO TABS
ORAL_TABLET | ORAL | Status: AC
  Filled 2018-06-02: qty 2

## 2018-06-02 MED ORDER — GLYCOPYRROLATE 0.2 MG/ML IJ SOLN
INTRAMUSCULAR | Status: DC | PRN
  Administered 2018-06-02: 0.1 mg via INTRAVENOUS

## 2018-06-02 MED ORDER — ALTEPLASE (STROKE) FULL DOSE INFUSION
0.9000 mg/kg | Freq: Once | INTRAVENOUS | Status: AC
  Administered 2018-06-02: 63.5 mg via INTRAVENOUS
  Filled 2018-06-02: qty 100

## 2018-06-02 MED ORDER — NICARDIPINE HCL IN NACL 20-0.86 MG/200ML-% IV SOLN
0.0000 mg/h | INTRAVENOUS | Status: DC
  Filled 2018-06-02: qty 200

## 2018-06-02 MED ORDER — CEFAZOLIN SODIUM-DEXTROSE 2-4 GM/100ML-% IV SOLN
INTRAVENOUS | Status: AC
  Filled 2018-06-02: qty 100

## 2018-06-02 MED ORDER — CLEVIDIPINE BUTYRATE 0.5 MG/ML IV EMUL
INTRAVENOUS | Status: DC | PRN
  Administered 2018-06-02: 2 mg/h via INTRAVENOUS

## 2018-06-02 MED ORDER — FENTANYL CITRATE (PF) 100 MCG/2ML IJ SOLN
INTRAMUSCULAR | Status: DC | PRN
  Administered 2018-06-02 (×2): 25 ug via INTRAVENOUS

## 2018-06-02 MED ORDER — LABETALOL HCL 5 MG/ML IV SOLN
10.0000 mg | INTRAVENOUS | Status: DC | PRN
  Administered 2018-06-03 – 2018-06-04 (×4): 10 mg via INTRAVENOUS
  Filled 2018-06-02: qty 4

## 2018-06-02 MED ORDER — ACETAMINOPHEN 325 MG PO TABS: 650.0000 mg | ORAL_TABLET | ORAL | Status: DC | PRN

## 2018-06-02 MED ORDER — NITROGLYCERIN 1 MG/10 ML FOR IR/CATH LAB
INTRA_ARTERIAL | Status: AC
  Filled 2018-06-02: qty 10

## 2018-06-02 MED ORDER — NITROGLYCERIN 1 MG/10 ML FOR IR/CATH LAB
INTRA_ARTERIAL | Status: DC | PRN
  Administered 2018-06-02: 25 ug via INTRA_ARTERIAL

## 2018-06-02 MED ORDER — ONDANSETRON HCL 4 MG/2ML IJ SOLN
INTRAMUSCULAR | Status: DC | PRN
  Administered 2018-06-02: 4 mg via INTRAVENOUS

## 2018-06-02 MED ORDER — LACTATED RINGERS IV SOLN
INTRAVENOUS | Status: DC | PRN
  Administered 2018-06-02: 14:00:00 via INTRAVENOUS

## 2018-06-02 MED ORDER — SENNOSIDES-DOCUSATE SODIUM 8.6-50 MG PO TABS: 1.0000 | ORAL_TABLET | Freq: Every evening | ORAL | Status: DC | PRN

## 2018-06-02 MED ORDER — CEFAZOLIN SODIUM-DEXTROSE 2-3 GM-%(50ML) IV SOLR
INTRAVENOUS | Status: DC | PRN
  Administered 2018-06-02: 2 g via INTRAVENOUS

## 2018-06-02 MED ORDER — CLOPIDOGREL BISULFATE 300 MG PO TABS
ORAL_TABLET | ORAL | Status: AC
  Filled 2018-06-02: qty 1

## 2018-06-02 MED ORDER — PHENYLEPHRINE HCL 10 MG/ML IJ SOLN
INTRAMUSCULAR | Status: DC | PRN
  Administered 2018-06-02: 10 ug/min via INTRAVENOUS

## 2018-06-02 MED ORDER — SODIUM CHLORIDE 0.9 % IV SOLN
INTRAVENOUS | Status: DC
  Administered 2018-06-03: 02:00:00 via INTRAVENOUS

## 2018-06-02 MED ORDER — IOPAMIDOL (ISOVUE-370) INJECTION 76%
50.0000 mL | Freq: Once | INTRAVENOUS | Status: AC | PRN
  Administered 2018-06-02: 50 mL via INTRAVENOUS

## 2018-06-02 MED ORDER — TIROFIBAN HCL IN NACL 5-0.9 MG/100ML-% IV SOLN
INTRAVENOUS | Status: AC
  Filled 2018-06-02: qty 100

## 2018-06-02 MED ORDER — CHLORHEXIDINE GLUCONATE 0.12 % MT SOLN
15.0000 mL | Freq: Two times a day (BID) | OROMUCOSAL | Status: DC
  Administered 2018-06-02 – 2018-06-05 (×6): 15 mL via OROMUCOSAL
  Filled 2018-06-02: qty 15

## 2018-06-02 MED ORDER — PHENYLEPHRINE HCL 10 MG/ML IJ SOLN
INTRAMUSCULAR | Status: DC | PRN
  Administered 2018-06-02: 120 ug via INTRAVENOUS
  Administered 2018-06-02: 20 ug via INTRAVENOUS
  Administered 2018-06-02: 40 ug via INTRAVENOUS
  Administered 2018-06-02 (×2): 20 ug via INTRAVENOUS

## 2018-06-02 MED ORDER — SUGAMMADEX SODIUM 200 MG/2ML IV SOLN
INTRAVENOUS | Status: DC | PRN
  Administered 2018-06-02: 200 mg via INTRAVENOUS

## 2018-06-02 MED ORDER — SUCCINYLCHOLINE CHLORIDE 20 MG/ML IJ SOLN
INTRAMUSCULAR | Status: DC | PRN
  Administered 2018-06-02: 100 mg via INTRAVENOUS

## 2018-06-02 MED ORDER — SODIUM CHLORIDE 0.9 % IV SOLN: 50.0000 mL | Freq: Once | INTRAVENOUS | Status: DC

## 2018-06-02 MED ORDER — PROPOFOL 10 MG/ML IV BOLUS
INTRAVENOUS | Status: DC | PRN
  Administered 2018-06-02: 140 mg via INTRAVENOUS

## 2018-06-02 MED ORDER — LIDOCAINE HCL 1 % IJ SOLN
INTRAMUSCULAR | Status: AC
  Filled 2018-06-02: qty 20

## 2018-06-02 MED ORDER — ORAL CARE MOUTH RINSE
15.0000 mL | Freq: Two times a day (BID) | OROMUCOSAL | Status: DC
Start: 2018-06-03 — End: 2018-06-05
  Administered 2018-06-03 – 2018-06-05 (×5): 15 mL via OROMUCOSAL

## 2018-06-02 MED ORDER — ASPIRIN 325 MG PO TABS
ORAL_TABLET | ORAL | Status: AC
  Filled 2018-06-02: qty 1

## 2018-06-02 MED ORDER — ACETAMINOPHEN 160 MG/5ML PO SOLN: 650.0000 mg | ORAL | Status: DC | PRN

## 2018-06-02 MED ORDER — ROCURONIUM BROMIDE 10 MG/ML (PF) SYRINGE
PREFILLED_SYRINGE | INTRAVENOUS | Status: DC | PRN
  Administered 2018-06-02: 45 mg via INTRAVENOUS

## 2018-06-02 NOTE — H&P (Signed)
Neurology H&P    History is obtained from: EMS  HPI: Logan Conner is a 84 y.o. male with past medical history of hyperlipidemia, hypertension, CKD stage III.  Most of the information was obtained from EMS as the wife has not arrived at this time and the incident needed emergent attention.  Attempts were made to call the wife however we only had the phone number of her home.  Per EMS patient was fully active prior to this event.  Patient was apparently reading a magazine flipping through some pages at 1315 when suddenly wife noted that he became very unresponsive with a left gaze.  EMS arrived and noticed that this was a code stroke and drove patient from [place] to [facility] for acute intervention.    NIHSS was 28 on arrival. CT head was negative for hemorhage. CTA of head and neck was obtained showing a left MCA cutoff.  tPA was administered and patient was immediately taken to interventional radiology for acute intervention.  Unfortunately patient is mute at this time and no information other than above can be gathered.  Per med rec patient was on aspirin and Plavix   LKW: 1315 on 06/02/2018 tpa given?:  Yes and then patient was sent to interventional radiology Premorbid modified Rankin scale (mRS): 0 NIHSS: 28 Stroke comorbidities include hypertension, hyperlipidemia.  ROS: Unable to obtain due to altered mental status.   Past Medical History:  Diagnosis Date  . Arthritis   . Asthma   . BPH (benign prostatic hyperplasia) 05/03/2014  . Cataract cortical, senile   . CKD (chronic kidney disease), stage III ([place]) 05/03/2014  . Collagen vascular disease (HCC)    RA  . Generalized OA 05/03/2014  . Glaucoma   . Hyperlipidemia   . Hyperlipidemia, unspecified 05/03/2014  . Hypertension 05/03/2014  . Hyperthyroidism   . Kidney stone 05/03/2014     Family History  Problem Relation Age of Onset  . Coronary artery disease Mother   . Cancer Sister   . Hypertension  Sister   . Prostate cancer Neg Hx     Social History:   reports that he quit smoking about 13 years ago. He has never used smokeless tobacco. He reports that he does not drink alcohol or use drugs.  Medications  Current Facility-Administered Medications:  .   stroke: mapping our early stages of recovery book, , Does not apply, Once, Kyre Jeffries, Karena Addison R, MD .  alteplase (ACTIVASE) 1 mg/mL infusion 63.5 mg, 0.9 mg/kg, Intravenous, Once, Last Rate: 63.5 mL/hr at 06/02/18 1403, 63.5 mg at 06/02/18 1403 **FOLLOWED BY** 0.9 %  sodium chloride infusion, 50 mL, Intravenous, Once, Benn Tarver, Karena Addison R, MD .  0.9 %  sodium chloride infusion, , Intravenous, Continuous, Edsel Shives, Karena Addison R, MD .  aspirin 325 MG tablet, , , ,  .  clopidogrel (PLAVIX) 300 MG tablet, , , ,  .  eptifibatide (INTEGRILIN) 20 MG/10ML injection, , , ,  .  labetalol (NORMODYNE,TRANDATE) injection 10 mg, 10 mg, Intravenous, Q10 min PRN, Julianne Chamberlin, Karena Addison R, MD .  lidocaine (XYLOCAINE) 1 % (with pres) injection, , , ,  .  nitroGLYCERIN 100 MCG/ML intra-arterial injection, , , ,  .  nitroGLYCERIN 100 MCG/ML intra-arterial injection, , , ,  .  senna-docusate (Senokot-S) tablet 1 tablet, 1 tablet, Oral, QHS PRN, Terasa Orsini, Karena Addison R, MD .  ticagrelor (BRILINTA) 90 MG tablet, , , ,  .  tirofiban (AGGRASTAT) 5-0.9 MG/100ML-% injection, , , ,  .  tirofiban (AGGRASTAT)  5-0.9 MG/100ML-% injection, , , ,   Current Outpatient Medications:  .  acetaminophen (TYLENOL) 650 MG CR tablet, Take 1,300 mg by mouth every 8 (eight) hours as needed for pain., Disp: , Rfl:  .  bimatoprost (LUMIGAN) 0.01 % SOLN, Place 1 drop into both eyes at bedtime. , Disp: , Rfl:  .  brimonidine (ALPHAGAN P) 0.1 % SOLN, Apply 1 drop to eye 2 (two) times daily. , Disp: , Rfl:  .  budesonide-formoterol (SYMBICORT) 160-4.5 MCG/ACT inhaler, Inhale 2 puffs into the lungs 2 (two) times daily. , Disp: , Rfl:  .  celecoxib (CELEBREX) 200 MG capsule, Take 1 capsule (200 mg total) by  mouth every 12 (twelve) hours., Disp: 60 capsule, Rfl: 0 .  cholecalciferol (VITAMIN D) 1000 units tablet, Take 1,000 Units by mouth daily., Disp: , Rfl:  .  finasteride (PROSCAR) 5 MG tablet, Take 1 tablet (5 mg total) by mouth daily., Disp: 90 tablet, Rfl: 3 .  hydroxychloroquine (PLAQUENIL) 200 MG tablet, TAKE 1 TABLET BY MOUTH ONCE DAILY., Disp: , Rfl:  .  levothyroxine (SYNTHROID) 50 MCG tablet, TAKE 1 TABLET BY MOUTH EVERY MORNING ON AN EMPTY STOMACH. AT LEAST 30-60 MINUTESBEFORE BREAKFAST, WITH A GLASS OF WATER, Disp: , Rfl:  .  Lidocaine (ASPERCREME LIDOCAINE) 4 % PTCH, Apply 1 patch topically daily. Apply to right hip (area of trochanter) for pain. Remove after 12 hours., Disp: , Rfl:  .  methocarbamol (ROBAXIN) 500 MG tablet, Take 1 tablet (500 mg total) by mouth every 6 (six) hours as needed for muscle spasms., Disp: 90 tablet, Rfl: 0 .  metoprolol succinate (TOPROL-XL) 50 MG 24 hr tablet, TAKE ONE TABLET BY MOUTH ONCE DAILY, Disp: , Rfl:  .  montelukast (SINGULAIR) 10 MG tablet, Take 10 mg by mouth at bedtime. , Disp: , Rfl:  .  Multiple Vitamins-Minerals (CEROVITE SENIOR) TABS, Take 1 tablet by mouth daily., Disp: , Rfl:  .  olmesartan (BENICAR) 40 MG tablet, TAKE 1 TABLET BY MOUTH ONCE DAILY., Disp: , Rfl:  .  omeprazole (PRILOSEC) 10 MG capsule, Take 10 mg by mouth daily as needed (indigestion). , Disp: , Rfl:  .  simvastatin (ZOCOR) 40 MG tablet, Take 40 mg by mouth daily at 6 PM., Disp: , Rfl:  .  tamsulosin (FLOMAX) 0.4 MG CAPS capsule, Take 1 capsule (0.4 mg total) by mouth daily after supper., Disp: 30 capsule, Rfl: 11 .  triamterene-hydrochlorothiazide (MAXZIDE) 75-50 MG tablet, Take 0.5 tablets by mouth daily. Take 1/2 tab, Disp: , Rfl:    Exam:  Current vital signs: Wt 70.6 kg (155 lb 9.6 oz)   BMI 22.33 kg/m  Vital signs in last 24 hours: Weight:  [70.6 kg (155 lb 9.6 oz)] 70.6 kg (155 lb 9.6 oz) (07/23 1300)  GENERAL: Awake, alert in NAD HEENT: - Normocephalic and  atraumatic, dry mm, no LN++, no Thyromegally LUNGS - Clear to auscultation bilaterally with no wheezes CV - S1S2 RRR, no m/r/g, equal pulses bilaterally. ABDOMEN - Soft, nontender, nondistended with normoactive BS Ext: warm, well perfused, intact peripheral pulses, __ edema  NEURO:  Mental Status: Awake, mute and not following any commands Cranial Nerves: PERRL Left gaze deviation, visual fields: R Homonymous hemianopsia, right facial droop.   Motor: Left UE and LLE : moving spontaneously. Right UE and LE was 0/5 strength.  Tone: is normal and bulk is normal Sensation- difficult to assess, right side neglect Coordination: No obvious ataxia  Gait- deferred   Labs I have reviewed  labs in epic and the results pertinent to this consultation are:   CBC    Component Value Date/Time   WBC 8.2 06/02/2018 1347   RBC 4.08 (L) 06/02/2018 1347   HGB 12.2 (L) 06/02/2018 1353   HCT 36.0 (L) 06/02/2018 1353   PLT 155 06/02/2018 1347   MCV 102.2 (H) 06/02/2018 1347   MCH 32.1 06/02/2018 1347   MCHC 31.4 06/02/2018 1347   RDW 13.4 06/02/2018 1347   LYMPHSABS 1.8 06/02/2018 1347   MONOABS 0.8 06/02/2018 1347   EOSABS 0.2 06/02/2018 1347   BASOSABS 0.0 06/02/2018 1347    CMP     Component Value Date/Time   NA 144 06/02/2018 1353   K 3.7 06/02/2018 1353   CL 109 06/02/2018 1353   CO2 23 06/10/2017 0141   GLUCOSE 130 (H) 06/02/2018 1353   BUN 27 (H) 06/02/2018 1353   CREATININE 1.20 06/02/2018 1353   CALCIUM 9.5 06/10/2017 0141   GFRNONAA >60 06/10/2017 0141   GFRAA >60 06/10/2017 0141    Lipid Panel  No results found for: CHOL, TRIG, HDL, CHOLHDL, VLDL, LDLCALC, LDLDIRECT   Imaging I have reviewed the images obtained:  CT-scan of the brain--positive for hyperdense left MCA M1 compatible with emergent large vessel occlusion, no hemorrhage or changes of acute infarct are identified.  Aspects of 10  CTA head and neck--positive for left MCA mid M1 emergent large vessel  occlusion.  No significant collateral flow on these early arterial phase images.  No left carotid stenosis.  Moderate to severe stenosis of the right PCA P2 segment.  Aortic atherosclerosis  MRI examination of the brain--will be obtained after interventional radiology  Assessment: 84 year old male presenting to the hospital as code stroke for sudden onset aphasia and right side weakness.  CT head and CTA demonstrated a left MCA stroke due to Left M1 occlusion. Pt received iv TPA and mechanical thrombectomy.     Left MCA acute ischemic stroke with Left m1 occlusion and poor collateral circulation s/p tPA and Emergenct mechanical thrombectomy.   Plan:  #Admit to ICU #Repeat CT of head in the morning #Hold antiplatelets until 24hrs post tPA #SCDs for DVT prophylaxis # MRI of the brain without contrast #Transthoracic Echo, may need TEE and loop monitor  #Start or continue Atorvastatin 80 mg/other high intensity statin # BP goal: per Neuro IR ( likely 120-140 mmHg) # HBAIC and Lipid profile # Telemetry monitoring # Frequent neuro checks # NPO until passes stroke swallow screen # please page stroke NP  Or  PA  Or MD from 8am -4 pm  as this patient from this time will be  followed by the stroke.   You can look them up on [email or web address]  Password [email or web address]   Hypertension  BP goal 120 -140 SBP s/p EMT Cleviprex initiated  HLD Atorvastatin when patient passes swallow eval  Lipid profile pending  DVT PPX; SCD Full code   This patient is neurologically critically ill due to left MCA stroke s./p tPA and mechanical thrombectomy.  He is at risk for significant risk of neurological worsening from cerebral edema,  death from brain herniation, heart failure, hemorrhagic conversion, infection, respiratory failure and seizure. This patient's care requires constant monitoring of vital signs, hemodynamics, respiratory and cardiac monitoring, review of multiple databases, neurological assessment, discussion  with family, other specialists and medical decision making of high complexity.  I spent  70  minutes of neurocritical time in the care of this patient.

## 2018-06-02 NOTE — Progress Notes (Signed)
Pharmacist Code Stroke Response  Notified to mix tPA at 1353 by Dr. [name] Delivered tPA to RN at 1358  Issues/delays encountered (if applicable): N/A  Logan Conner, Logan Conner 06/02/18 1:59 PM

## 2018-06-02 NOTE — Procedures (Signed)
S/p Lt common carotid arteriogram followed by complete revascularization of Lt MCA M 1 occlusion with x 1 pass with embotrap  36mm x 33 mm retriever device achieving a TICI 3 revascularization

## 2018-06-02 NOTE — Anesthesia Procedure Notes (Signed)
Procedure Name: Intubation Date/Time: 06/02/2018 2:27 PM Performed by: Leonor Liv, CRNA Pre-anesthesia Checklist: Patient identified, Emergency Drugs available, Suction available and Patient being monitored Patient Re-evaluated:Patient Re-evaluated prior to induction Oxygen Delivery Method: Circle System Utilized Preoxygenation: Pre-oxygenation with 100% oxygen Induction Type: IV induction Ventilation: Mask ventilation without difficulty Laryngoscope Size: Mac and 4 Grade View: Grade I Tube type: Subglottic suction tube Tube size: 7.5 mm Number of attempts: 1 Airway Equipment and Method: Stylet and Oral airway Placement Confirmation: ETT inserted through vocal cords under direct vision,  positive ETCO2 and breath sounds checked- equal and bilateral Secured at: 23 cm Tube secured with: Tape (left) Dental Injury: Teeth and Oropharynx as per pre-operative assessment

## 2018-06-02 NOTE — ED Triage Notes (Signed)
See stroke notes

## 2018-06-02 NOTE — Anesthesia Preprocedure Evaluation (Signed)
Anesthesia Evaluation  Patient identified by MRN, date of birth, ID band Patient awake and Patient confused    Reviewed: Allergy & Precautions, NPO status , Patient's Chart, lab work & pertinent test results, Unable to perform ROS - Chart review onlyPreop documentation limited or incomplete due to emergent nature of procedure.  Airway Mallampati: II  TM Distance: >3 FB Neck ROM: Full    Dental no notable dental hx.    Pulmonary asthma , former smoker,    Pulmonary exam normal breath sounds clear to auscultation       Cardiovascular hypertension, Pt. on medications negative cardio ROS Normal cardiovascular exam Rhythm:Regular Rate:Normal     Neuro/Psych CVA negative psych ROS   GI/Hepatic negative GI ROS, Neg liver ROS,   Endo/Other  negative endocrine ROS  Renal/GU Renal InsufficiencyRenal disease  negative genitourinary   Musculoskeletal  (+) Arthritis , Osteoarthritis,    Abdominal   Peds negative pediatric ROS (+)  Hematology negative hematology ROS (+)   Anesthesia Other Findings   Reproductive/Obstetrics negative OB ROS                             Anesthesia Physical Anesthesia Plan  ASA: III and emergent  Anesthesia Plan: General   Post-op Pain Management:    Induction: Intravenous  PONV Risk Score and Plan: 2 and Ondansetron and Treatment may vary due to age or medical condition  Airway Management Planned: Oral ETT  Additional Equipment:   Intra-op Plan:   Post-operative Plan: Possible Post-op intubation/ventilation  Informed Consent: I have reviewed the patients History and Physical, chart, labs and discussed the procedure including the risks, benefits and alternatives for the proposed anesthesia with the patient or authorized representative who has indicated his/her understanding and acceptance.   Dental advisory given  Plan Discussed with: CRNA  Anesthesia Plan  Comments: (Code stroke)        Anesthesia Quick Evaluation

## 2018-06-02 NOTE — ED Notes (Signed)
Pt to ED via [facility] from home. Was sitting in chair reading, wife walked into room and found pt slumped over. Pt has left sided gaze, ignoring right side completely.  Initial VS -- 151/75 P-66, 99%

## 2018-06-02 NOTE — Code Documentation (Signed)
84yo male arriving to [facility] via [place] at 1345. Patient from home where he was witnessed to be reading/flipping through a magazine at 1305. His family later returned to find him unresponsive. EMS called and activated a code stroke. Patient ambulates with cane at baseline per EMS. Stroke team at the bedside on patient arrival. Labs drawn and patient to CT with team. CT completed showing hyperdense left MCA M1. NIHSS 28, see documentation for details and code stroke times. Patient with left gaze, right hemianopia, right hemiplegia and mute on exam. CTA showing left MCA mid M1. BP 151/75. Foley catheter placed by ED RN. 6.4mg  tPA bolus given over 1 minute at 1403 followed by 57.1mg /hr for a total of 63.5mg  per pharmacy dosing. Patient transported to IR holding while IR table off-loaded. Handoff with IR RN. Patient reassessed with no changes in neuro assessment. Patient transferred to IR room. Handoff with CRNA.

## 2018-06-02 NOTE — Transfer of Care (Signed)
Immediate Anesthesia Transfer of Care Note  Patient: Logan Conner  Procedure(s) Performed: RADIOLOGY WITH ANESTHESIA (N/A )  Patient Location: PACU  Anesthesia Type:General  Level of Consciousness: awake and alert   Airway & Oxygen Therapy: Patient Spontanous Breathing and Patient connected to face mask oxygen  Post-op Assessment: Report given to RN and Post -op Vital signs reviewed and stable  Post vital signs: Reviewed and stable  Last Vitals:  Vitals Value Taken Time  BP 126/91 06/02/2018  4:12 PM  Temp    Pulse 81 06/02/2018  4:15 PM  Resp 18 06/02/2018  4:15 PM  SpO2 99 % 06/02/2018  4:15 PM  Vitals shown include unvalidated device data.  Last Pain:  Vitals:   06/02/18 1610  PainSc: (P) 0-No pain         Complications: No apparent anesthesia complications

## 2018-06-02 NOTE — ED Provider Notes (Signed)
[facility] EMERGENCY DEPARTMENT Provider Note   CSN: [id number] Arrival date & time: 06/02/18  1345     History   Chief Complaint Chief Complaint  Patient presents with  . Code Stroke    HPI Logan Conner is a 84 y.o. male.  HPI Patient presents by EMS for acute onset right-sided weakness, a aphasia and left gaze deviation starting 15 minutes prior to EMS arrival at 1315.  Patient lives with wife who called EMS.  Is unable to contribute to history.  Level 5 caveat applies. Past Medical History:  Diagnosis Date  . Arthritis   . Asthma   . BPH (benign prostatic hyperplasia) 05/03/2014  . Cataract cortical, senile   . CKD (chronic kidney disease), stage III ([place]) 05/03/2014  . Collagen vascular disease (HCC)    RA  . Generalized OA 05/03/2014  . Glaucoma   . Hyperlipidemia   . Hyperlipidemia, unspecified 05/03/2014  . Hypertension 05/03/2014  . Hyperthyroidism   . Kidney stone 05/03/2014    Patient Active Problem List   Diagnosis Date Noted  . Acute ischemic left MCA stroke ([place]) 06/02/2018  . Middle cerebral artery embolism, left 06/02/2018  . Right hip pain 06/10/2017  . Neurogenic claudication due to lumbar spinal stenosis 06/04/2017    Past Surgical History:  Procedure Laterality Date  . CATARACT EXTRACTION Right   . COLONOSCOPY  02/21/2006  . ESOPHAGOGASTRODUODENOSCOPY  02/21/2006  . JOINT REPLACEMENT Right    total hip  . kidney stone    . LITHOTRIPSY    . LUMBAR LAMINECTOMY/DECOMPRESSION MICRODISCECTOMY Right 06/04/2017   Procedure: LUMBAR LAMINECTOMY/DECOMPRESSION MICRODISCECTOMY 2 LEVELS L4-S1;  Surgeon: Meade Maw, MD;  Location: [facility] ORS;  Service: Neurosurgery;  Laterality: Right;  . TOTAL HIP ARTHROPLASTY Right         Home Medications    Prior to Admission medications   Medication Sig Start Date End Date Taking? Authorizing Provider  acetaminophen (TYLENOL) 650 MG CR tablet Take 1,300 mg by mouth every 8  (eight) hours as needed for pain.   Yes [provider]  bimatoprost (LUMIGAN) 0.01 % SOLN Place 1 drop into both eyes at bedtime.  04/21/14  Yes [provider]  brimonidine (ALPHAGAN P) 0.1 % SOLN Apply 1 drop to eye 3 (three) times daily.    Yes [provider]  budesonide-formoterol (SYMBICORT) 160-4.5 MCG/ACT inhaler Inhale 2 puffs into the lungs 2 (two) times daily.    Yes [provider]  finasteride (PROSCAR) 5 MG tablet Take 1 tablet (5 mg total) by mouth daily. 03/16/18 03/16/19 Yes Festus Aloe, MD  hydroxychloroquine (PLAQUENIL) 200 MG tablet TAKE 1 TABLET BY MOUTH ONCE DAILY. 02/26/16  Yes [provider]  levothyroxine (SYNTHROID) 50 MCG tablet TAKE 1 TABLET BY MOUTH EVERY MORNING ON AN EMPTY STOMACH. AT LEAST 30-60 [place], WITH A GLASS OF WATER 08/12/16  Yes [provider]  metoprolol succinate (TOPROL-XL) 50 MG 24 hr tablet TAKE ONE TABLET BY MOUTH ONCE DAILY 04/30/16  Yes [provider]  montelukast (SINGULAIR) 10 MG tablet Take 10 mg by mouth at bedtime.  05/16/16  Yes [provider]  Multiple Vitamins-Minerals (CEROVITE SENIOR) TABS Take 1 tablet by mouth daily.   Yes [provider]  olmesartan (BENICAR) 40 MG tablet TAKE 1 TABLET BY MOUTH ONCE DAILY. 01/29/16  Yes [provider]  simvastatin (ZOCOR) 40 MG tablet Take 40 mg by mouth daily at 6 PM.   Yes [provider]  tamsulosin (  FLOMAX) 0.4 MG CAPS capsule Take 1 capsule (0.4 mg total) by mouth daily after supper. 08/20/17  Yes Festus Aloe, MD    Family History Family History  Problem Relation Age of Onset  . Coronary artery disease Mother   . Cancer Sister   . Hypertension Sister   . Prostate cancer Neg Hx     Social History Social History   Tobacco Use  . Smoking status: Former Smoker    Last attempt to quit: 08/16/2004    Years since quitting: 13.8  . Smokeless tobacco: Never Used  Substance  Use Topics  . Alcohol use: No  . Drug use: No     Allergies   Patient has no known allergies.   Review of Systems Review of Systems  Unable to perform ROS: Patient nonverbal     Physical Exam Updated Vital Signs BP 117/61   Pulse 81   Temp 99 F (37.2 C) (Axillary)   Resp (!) 25   Ht 6' (1.829 m)   Wt 68.8 kg (151 lb 10.8 oz)   SpO2 95%   BMI 20.57 kg/m   Physical Exam  Constitutional: He appears well-developed and well-nourished. No distress.  HENT:  Head: Normocephalic and atraumatic.  Mouth/Throat: Oropharynx is clear and moist.  Eyes: Pupils are equal, round, and reactive to light.  Left gaze preference  Neck: Normal range of motion. Neck supple.  Cardiovascular: Normal rate and regular rhythm.  Pulmonary/Chest: Effort normal and breath sounds normal. No stridor. No respiratory distress. He has no wheezes. He has no rales. He exhibits no tenderness.  Abdominal: Soft. Bowel sounds are normal. There is no tenderness. There is no rebound and no guarding.  Musculoskeletal: Normal range of motion. He exhibits no edema or tenderness.  Neurological: He is alert.  Aphasia.  Left gaze deviation.  Mild right facial droop.  0/5 motor in right upper and right lower extremity.  Appears to be spontaneously moving left side of body.  Skin: Skin is warm and dry. Capillary refill takes less than 2 seconds. No rash noted. He is not diaphoretic. No erythema.  Psychiatric: He has a normal mood and affect. His behavior is normal.  Nursing note and vitals reviewed.    ED Treatments / Results  Labs (all labs ordered are listed, but only abnormal results are displayed) Labs Reviewed  CBC - Abnormal; Notable for the following components:      Result Value   RBC 4.08 (*)    MCV 102.2 (*)    All other components within normal limits  COMPREHENSIVE METABOLIC PANEL - Abnormal; Notable for the following components:   Glucose, Bld 135 (*)    BUN 24 (*)    Total Protein 5.8 (*)     GFR calc non Af Amer 56 (*)    All other components within normal limits  HEMOGLOBIN A1C - Abnormal; Notable for the following components:   Hgb A1c MFr Bld 6.0 (*)    All other components within normal limits  CBC WITH DIFFERENTIAL/PLATELET - Abnormal; Notable for the following components:   WBC 12.0 (*)    RBC 3.74 (*)    Hemoglobin 11.9 (*)    HCT 36.5 (*)    Neutro Abs 9.9 (*)    All other components within normal limits  BASIC METABOLIC PANEL - Abnormal; Notable for the following components:   Chloride 114 (*)    Glucose, Bld 129 (*)    Calcium 8.7 (*)    All other components  within normal limits  CBG MONITORING, ED - Abnormal; Notable for the following components:   Glucose-Capillary 126 (*)    All other components within normal limits  I-STAT CHEM 8, ED - Abnormal; Notable for the following components:   BUN 27 (*)    Glucose, Bld 130 (*)    Calcium, Ion 1.14 (*)    Hemoglobin 12.2 (*)    HCT 36.0 (*)    All other components within normal limits  MRSA PCR SCREENING  PROTIME-INR  APTT  DIFFERENTIAL  LIPID PANEL  I-STAT TROPONIN, ED    EKG None  Radiology Ct Angio Head W Or Wo Contrast  Result Date: 06/02/2018 CLINICAL DATA:  84 year old male with right side weakness, aphasia. Evidence of left MCA LVO on noncontrast head CT. EXAM: CT ANGIOGRAPHY HEAD AND NECK TECHNIQUE: Multidetector CT imaging of the head and neck was performed using the standard protocol during bolus administration of intravenous contrast. Multiplanar CT image reconstructions and MIPs were obtained to evaluate the vascular anatomy. Carotid stenosis measurements (when applicable) are obtained utilizing NASCET criteria, using the distal internal carotid diameter as the denominator. CONTRAST:  27mL ISOVUE-370 IOPAMIDOL (ISOVUE-370) INJECTION 76% COMPARISON:  Head CT without contrast 1358 hours today. FINDINGS: CT HEAD Reported separately. CTA NECK Skeleton: Motion artifact about the mandible. Widespread  severe cervical spine degeneration. Osteopenia. No acute osseous abnormality identified. Upper chest: Negative lung apices. No superior mediastinal lymphadenopathy. Other neck: Negative. No neck mass or lymphadenopathy. Aortic arch: 3 vessel arch configuration. Moderate Calcified aortic atherosclerosis. Right carotid system: No brachiocephalic or right CCA origin stenosis. Widely patent right carotid bifurcation and cervical right ICA. Left carotid system: No left CCA origin stenosis despite mild soft and calcified plaque. Mildly tortuous left CCA. Calcified plaque at the left ICA origin but widely patent proximal left ICA. No cervical left ICA stenosis or significant tortuosity. Vertebral arteries: No proximal right subclavian artery stenosis despite calcified plaque. Normal right vertebral artery origin. The right vertebral artery appears mildly dominant and normal to the skull base. No proximal left subclavian artery stenosis despite calcified plaque. Normal left vertebral artery origin. The left vertebral is mildly non dominant and patent to the skull base without stenosis. CTA HEAD Posterior circulation: Mild right Vertebral artery V4 calcified plaque without stenosis. No distal left vertebral artery stenosis. Tortuous distal vertebral arteries. Normal PICA origins. Patent vertebrobasilar junction and basilar artery without stenosis. Normal SCA and PCA origins. Posterior communicating arteries are diminutive or absent. Left PCA branches are within normal limits. There is a moderate to severe stenosis of the right PCA P2 segment, but the remaining right PCA branches are within normal limits (series 12, image 19). Anterior circulation: Both ICA siphons are patent with predominantly mild calcified plaque. No left patent left ICA terminus. There is mild to moderate right ICA supraclinoid segment stenosis due to calcified plaque. Patent right ICA terminus. The left MCA M1 segment is occluded about 10 millimeters  beyond its origin just distal to the origin of a small left anterior temporal artery. There is very little early arterial phase collateral flow in the left MCA branches. The left ACA A1 segment is dominant and normal. Anterior communicating artery is mildly ectatic without aneurysm. Proximal ACA A2 branches are tortuous. Other ACA branches are within normal limits. Right MCA M1 segment and right MCA bifurcation are patent. Right MCA branches are within normal limits. Siphon stenosis. Venous sinuses: Not well evaluated due to early contrast timing. Anatomic variants: Mildly dominant right vertebral artery.  Dominant left ACA A1 segment. Review of the MIP images confirms the above findings IMPRESSION: 1. Positive for Left MCA mid M1 emergent large vessel occlusion. No significant collateral flow on these early arterial phase images. This result was communicated to Dr. [name] at at [place] hours by text page via the [facility] messaging system. 2. No left carotid stenosis and no other arterial stenosis in the neck. 3. There is a moderate to severe stenosis of the right PCA P2 segment. Otherwise negative posterior circulation. 4.  Aortic Atherosclerosis (ICD10-I70.0). Electronically Signed   By: Genevie Ann M.D.   On: 06/02/2018 14:21   Ct Angio Neck W Or Wo Contrast  Result Date: 06/02/2018 CLINICAL DATA:  84 year old male with right side weakness, aphasia. Evidence of left MCA LVO on noncontrast head CT. EXAM: CT ANGIOGRAPHY HEAD AND NECK TECHNIQUE: Multidetector CT imaging of the head and neck was performed using the standard protocol during bolus administration of intravenous contrast. Multiplanar CT image reconstructions and MIPs were obtained to evaluate the vascular anatomy. Carotid stenosis measurements (when applicable) are obtained utilizing NASCET criteria, using the distal internal carotid diameter as the denominator. CONTRAST:  67mL ISOVUE-370 IOPAMIDOL (ISOVUE-370) INJECTION 76% COMPARISON:  Head CT without contrast  1358 hours today. FINDINGS: CT HEAD Reported separately. CTA NECK Skeleton: Motion artifact about the mandible. Widespread severe cervical spine degeneration. Osteopenia. No acute osseous abnormality identified. Upper chest: Negative lung apices. No superior mediastinal lymphadenopathy. Other neck: Negative. No neck mass or lymphadenopathy. Aortic arch: 3 vessel arch configuration. Moderate Calcified aortic atherosclerosis. Right carotid system: No brachiocephalic or right CCA origin stenosis. Widely patent right carotid bifurcation and cervical right ICA. Left carotid system: No left CCA origin stenosis despite mild soft and calcified plaque. Mildly tortuous left CCA. Calcified plaque at the left ICA origin but widely patent proximal left ICA. No cervical left ICA stenosis or significant tortuosity. Vertebral arteries: No proximal right subclavian artery stenosis despite calcified plaque. Normal right vertebral artery origin. The right vertebral artery appears mildly dominant and normal to the skull base. No proximal left subclavian artery stenosis despite calcified plaque. Normal left vertebral artery origin. The left vertebral is mildly non dominant and patent to the skull base without stenosis. CTA HEAD Posterior circulation: Mild right Vertebral artery V4 calcified plaque without stenosis. No distal left vertebral artery stenosis. Tortuous distal vertebral arteries. Normal PICA origins. Patent vertebrobasilar junction and basilar artery without stenosis. Normal SCA and PCA origins. Posterior communicating arteries are diminutive or absent. Left PCA branches are within normal limits. There is a moderate to severe stenosis of the right PCA P2 segment, but the remaining right PCA branches are within normal limits (series 12, image 19). Anterior circulation: Both ICA siphons are patent with predominantly mild calcified plaque. No left patent left ICA terminus. There is mild to moderate right ICA supraclinoid  segment stenosis due to calcified plaque. Patent right ICA terminus. The left MCA M1 segment is occluded about 10 millimeters beyond its origin just distal to the origin of a small left anterior temporal artery. There is very little early arterial phase collateral flow in the left MCA branches. The left ACA A1 segment is dominant and normal. Anterior communicating artery is mildly ectatic without aneurysm. Proximal ACA A2 branches are tortuous. Other ACA branches are within normal limits. Right MCA M1 segment and right MCA bifurcation are patent. Right MCA branches are within normal limits. Siphon stenosis. Venous sinuses: Not well evaluated due to early contrast timing. Anatomic variants: Mildly dominant  right vertebral artery. Dominant left ACA A1 segment. Review of the MIP images confirms the above findings IMPRESSION: 1. Positive for Left MCA mid M1 emergent large vessel occlusion. No significant collateral flow on these early arterial phase images. This result was communicated to Dr. [name] at at [place] hours by text page via the [facility] messaging system. 2. No left carotid stenosis and no other arterial stenosis in the neck. 3. There is a moderate to severe stenosis of the right PCA P2 segment. Otherwise negative posterior circulation. 4.  Aortic Atherosclerosis (ICD10-I70.0). Electronically Signed   By: Genevie Ann M.D.   On: 06/02/2018 14:21   Ct Head Code Stroke Wo Contrast  Result Date: 06/02/2018 CLINICAL DATA:  Code stroke. 84 year old male with right side weakness, aphasia. EXAM: CT HEAD WITHOUT CONTRAST TECHNIQUE: Contiguous axial images were obtained from the base of the skull through the vertex without intravenous contrast. COMPARISON:  No prior brain imaging. FINDINGS: Brain: No changes of acute cortically based infarct identified. Gray-white matter differentiation appears maintained in the left hemisphere. No acute intracranial hemorrhage identified. No midline shift, mass effect, or evidence of  intracranial mass lesion. No ventriculomegaly. No cortical encephalomalacia identified, normal white matter and deep gray matter nuclei appearance for age. Vascular: Calcified atherosclerosis at the skull base. Conspicuous abnormal left MCA M1 hyperdensity (series 5, image 32). Skull: No acute osseous abnormality identified. Sinuses/Orbits: Minor bubbly opacity in the left sphenoid. Otherwise the visible paranasal sinuses are well pneumatized. The tympanic cavities and mastoids are clear. Other: There is some leftward gaze deviation. No other acute orbit or scalp soft tissue finding. ASPECTS [facility] Stroke Program Early CT Score) - Ganglionic level infarction (caudate, lentiform nuclei, internal capsule, insula, M1-M3 cortex): 7 - Supraganglionic infarction (M4-M6 cortex): 3 Total score (0-10 with 10 being normal): 10 IMPRESSION: 1. Positive for hyperdense Left MCA M1 compatible with Emergent Large Vessel Occlusion. 2. No hemorrhage or changes of acute infarct are identified. ASPECTS 10. 3. These results were communicated to Dr. [name] at 2:01 pmon 7/23/2019by text page via the [facility] messaging system. Electronically Signed   By: Genevie Ann M.D.   On: 06/02/2018 14:02    Procedures Procedures (including critical care time)  Medications Ordered in ED Medications  alteplase (ACTIVASE) 1 mg/mL infusion 63.5 mg (63.5 mg Intravenous New Bag/Given 06/02/18 1403)    Followed by  0.9 %  sodium chloride infusion (has no administration in time range)  0.9 %  sodium chloride infusion ( Intravenous Duplicate 7/93/90 3009)  senna-docusate (Senokot-S) tablet 1 tablet (has no administration in time range)  labetalol (NORMODYNE,TRANDATE) injection 10 mg (has no administration in time range)  nitroGLYCERIN 1 mg/10 ml (100 mcg/ml) - IR/CATH LAB (25 mcg Intra-arterial Given 06/02/18 1518)  acetaminophen (TYLENOL) tablet 650 mg (has no administration in time range)    Or  acetaminophen (TYLENOL) solution 650 mg (has no  administration in time range)    Or  acetaminophen (TYLENOL) suppository 650 mg (has no administration in time range)  0.9 %  sodium chloride infusion ( Intravenous Rate/Dose Verify 06/03/18 0725)  clevidipine (CLEVIPREX) infusion 0.5 mg/mL (21 mg/hr Intravenous New Bag/Given 06/03/18 0728)  chlorhexidine (PERIDEX) 0.12 % solution 15 mL (15 mLs Mouth Rinse Given 06/02/18 2207)  MEDLINE mouth rinse (has no administration in time range)  iopamidol (ISOVUE-370) 76 % injection 50 mL (50 mLs Intravenous Contrast Given 06/02/18 1405)  nitroGLYCERIN 100 MCG/ML intra-arterial injection (  Duplicate 2/33/00 7622)  nitroGLYCERIN 100 MCG/ML intra-arterial injection (  Duplicate  06/02/18 1900)   stroke: mapping our early stages of recovery book (1 each Does not apply Given 06/02/18 2207)  ceFAZolin (ANCEF) 2-4 MC/[id number]-% IVPB (  Duplicate 05/09/35 6294)     Initial Impression / Assessment and Plan / ED Course  I have reviewed the triage vital signs and the nursing notes.  Pertinent labs & imaging results that were available during my care of the patient were reviewed by me and considered in my medical decision making (see chart for details).     Taken by neurology to IR suite after given TPA.  Final Clinical Impressions(s) / ED Diagnoses   Final diagnoses:  Ischemic stroke [facility])    ED Discharge Orders    None       Julianne Rice, MD 06/03/18 [phone]

## 2018-06-02 NOTE — Anesthesia Procedure Notes (Signed)
Arterial Line Insertion Performed by: Wilburn Cornelia, CRNA  Preanesthetic checklist: patient identified, IV checked, site marked, risks and benefits discussed, surgical consent, monitors and equipment checked, pre-op evaluation and timeout performed Left, radial was placed Catheter size: 20 G Hand hygiene performed  Allen's test indicative of satisfactory collateral circulation Attempts: 1 Procedure performed without using ultrasound guided technique. Following insertion, dressing applied and Biopatch. Post procedure assessment: normal  Patient tolerated the procedure well with no immediate complications.

## 2018-06-02 NOTE — Progress Notes (Signed)
Patient ID: Logan Conner, male   DOB: [birth year], 84 y.o.   MRN: [id number] INR. 56 yr Rt H M MRS 1 -2 as per daughter and  Wife . LSW 12 45 pm.Unresponsive  Witnessed by spouse. NIH score 28. CT Brain NO ICH Hyperdense Lt MCA sign. CTA occluded Lt LT MCA M1. ASPECTS 10.. IVTPA given..  Endovascular option  discussed with neuro hospitalist. Family on the way..Emergent consent was signed by Dr [name] and myself for endovascular revascularization . Following the procedure management reviewed with spouse ,daughter and son in law who is a physician. Reasons  and risks of the treatment ,including  ICH of 10 %, worsening of neurological function ,vent dependency and death were discussed.. Questions were answered to their satisfaction. S.Osborn Pullin .MD

## 2018-06-02 NOTE — Anesthesia Postprocedure Evaluation (Signed)
Anesthesia Post Note  Patient: Logan Conner  Procedure(s) Performed: RADIOLOGY WITH ANESTHESIA (N/A )     Patient location during evaluation: PACU Anesthesia Type: General Level of consciousness: awake and alert Pain management: pain level controlled Vital Signs Assessment: post-procedure vital signs reviewed and stable Respiratory status: spontaneous breathing, nonlabored ventilation, respiratory function stable and patient connected to nasal cannula oxygen Cardiovascular status: blood pressure returned to baseline and stable Postop Assessment: no apparent nausea or vomiting Anesthetic complications: no    Last Vitals:  Vitals:   06/02/18 1700 06/02/18 1715  BP: (!) 111/55 121/64  Pulse: 87 84  Resp: (!) 23 (!) 22  Temp:    SpO2: 97% 96%    Last Pain:  Vitals:   06/02/18 1645  TempSrc: Axillary  PainSc:                  Wheeler [employer] DAVID

## 2018-06-02 NOTE — Progress Notes (Signed)
Cleviprex stopped at 1702 r/t blood pressure parameters, please see eMAR for further titration. Instructed to titrate medication with A-line readings. Will continue to monitor.Lianne Bushy RN BSN

## 2018-06-03 ENCOUNTER — Inpatient Hospital Stay (HOSPITAL_COMMUNITY): Payer: Medicare Other

## 2018-06-03 ENCOUNTER — Encounter (HOSPITAL_COMMUNITY): Payer: Self-pay | Admitting: Interventional Radiology

## 2018-06-03 DIAGNOSIS — L8990 Pressure ulcer of unspecified site, unspecified stage: Secondary | ICD-10-CM

## 2018-06-03 DIAGNOSIS — I361 Nonrheumatic tricuspid (valve) insufficiency: Secondary | ICD-10-CM

## 2018-06-03 DIAGNOSIS — I63512 Cerebral infarction due to unspecified occlusion or stenosis of left middle cerebral artery: Secondary | ICD-10-CM

## 2018-06-03 LAB — CBC WITH DIFFERENTIAL/PLATELET
ABS IMMATURE GRANULOCYTES: 0 10*3/uL (ref 0.0–0.1)
BASOS PCT: 0 %
Basophils Absolute: 0 10*3/uL (ref 0.0–0.1)
Eosinophils Absolute: 0.1 10*3/uL (ref 0.0–0.7)
Eosinophils Relative: 1 %
HCT: 36.5 % — ABNORMAL LOW (ref 39.0–52.0)
HEMOGLOBIN: 11.9 g/dL — AB (ref 13.0–17.0)
Immature Granulocytes: 0 %
Lymphocytes Relative: 8 %
Lymphs Abs: 1 10*3/uL (ref 0.7–4.0)
MCH: 31.8 pg (ref 26.0–34.0)
MCHC: 32.6 g/dL (ref 30.0–36.0)
MCV: 97.6 fL (ref 78.0–100.0)
Monocytes Absolute: 1 10*3/uL (ref 0.1–1.0)
Monocytes Relative: 8 %
Neutro Abs: 9.9 10*3/uL — ABNORMAL HIGH (ref 1.7–7.7)
Neutrophils Relative %: 83 %
Platelets: 154 10*3/uL (ref 150–400)
RBC: 3.74 MIL/uL — ABNORMAL LOW (ref 4.22–5.81)
RDW: 13.2 % (ref 11.5–15.5)
WBC: 12 10*3/uL — AB (ref 4.0–10.5)

## 2018-06-03 LAB — LIPID PANEL
Cholesterol: 124 mg/dL (ref 0–200)
HDL: 49 mg/dL (ref >40)
LDL CALC: 60 mg/dL (ref 0–99)
Total CHOL/HDL Ratio: 2.5 RATIO
Triglycerides: 76 mg/dL (ref <150)
VLDL: 15 mg/dL (ref 0–40)

## 2018-06-03 LAB — HEMOGLOBIN A1C
Hgb A1c MFr Bld: 6 % — ABNORMAL HIGH (ref 4.8–5.6)
Mean Plasma Glucose: 125.5 mg/dL

## 2018-06-03 LAB — BASIC METABOLIC PANEL
Anion gap: 7 (ref 5–15)
BUN: 21 mg/dL (ref 8–23)
CHLORIDE: 114 mmol/L — AB (ref 98–111)
CO2: 22 mmol/L (ref 22–32)
Calcium: 8.7 mg/dL — ABNORMAL LOW (ref 8.9–10.3)
Creatinine, Ser: 0.94 mg/dL (ref 0.61–1.24)
GFR calc Af Amer: >60 mL/min (ref >60)
GFR calc non Af Amer: >60 mL/min (ref >60)
GLUCOSE: 129 mg/dL — AB (ref 70–99)
POTASSIUM: 3.8 mmol/L (ref 3.5–5.1)
Sodium: 143 mmol/L (ref 135–145)

## 2018-06-03 LAB — ECHOCARDIOGRAM COMPLETE
HEIGHTINCHES: 72 in
WEIGHTICAEL: 2426.82 [oz_av]

## 2018-06-03 MED ORDER — ASPIRIN 600 MG RE SUPP
300.0000 mg | Freq: Every day | RECTAL | Status: DC
  Administered 2018-06-03 – 2018-06-04 (×2): 300 mg via RECTAL
  Filled 2018-06-03 (×3): qty 1

## 2018-06-03 MED ORDER — ASPIRIN 300 MG RE SUPP: 300.0000 mg | Freq: Every day | RECTAL | Status: DC

## 2018-06-03 MED ORDER — SODIUM CHLORIDE 0.9 % IV SOLN: INTRAVENOUS | Status: DC

## 2018-06-03 MED ORDER — COLLAGENASE 250 UNIT/GM EX OINT
TOPICAL_OINTMENT | Freq: Every day | CUTANEOUS | Status: DC
  Administered 2018-06-03 – 2018-06-05 (×3): via TOPICAL
  Filled 2018-06-03: qty 30

## 2018-06-03 NOTE — Evaluation (Signed)
Clinical/Bedside Swallow Evaluation Patient Details  Name: Logan Conner MRN: [id number] Date of Birth: 1924/12/10  Today's Date: 06/03/2018 Time: SLP Start Time (ACUTE ONLY): 0941 SLP Stop Time (ACUTE ONLY): 0951 SLP Time Calculation (min) (ACUTE ONLY): 10 min  Past Medical History:  Past Medical History:  Diagnosis Date  . Arthritis   . Asthma   . BPH (benign prostatic hyperplasia) 05/03/2014  . Cataract cortical, senile   . CKD (chronic kidney disease), stage III () 05/03/2014  . Collagen vascular disease (HCC)    RA  . Generalized OA 05/03/2014  . Glaucoma   . Hyperlipidemia   . Hyperlipidemia, unspecified 05/03/2014  . Hypertension 05/03/2014  . Hyperthyroidism   . Kidney stone 05/03/2014   Past Surgical History:  Past Surgical History:  Procedure Laterality Date  . CATARACT EXTRACTION Right   . COLONOSCOPY  02/21/2006  . ESOPHAGOGASTRODUODENOSCOPY  02/21/2006  . JOINT REPLACEMENT Right    total hip  . kidney stone    . LITHOTRIPSY    . LUMBAR LAMINECTOMY/DECOMPRESSION MICRODISCECTOMY Right 06/04/2017   Procedure: LUMBAR LAMINECTOMY/DECOMPRESSION MICRODISCECTOMY 2 LEVELS L4-S1;  Surgeon: Meade Maw, MD;  Location: [facility];  Service: Neurosurgery;  Laterality: Right;  . RADIOLOGY WITH ANESTHESIA N/A 06/02/2018   Procedure: RADIOLOGY WITH ANESTHESIA;  Surgeon: Luanne Bras, MD;  Location: [place];  Service: Radiology;  Laterality: N/A;  . TOTAL HIP ARTHROPLASTY Right    HPI:  Logan Conner is a 84 y.o. male with past medical history of hyperlipidemia, hypertension, CKD stage III.  Most of the information was obtained from EMS as the wife has not arrived at this time and the incident needed emergent attention.  Attempts were made to call the wife however we only had the phone number of her home.  Per EMS patient was fully active prior to this event.  Patient was apparently reading a magazine flipping through some pages at 1315 when suddenly wife noted  that he became very unresponsive with a left gaze.  EMS arrived and noticed that this was a code stroke and drove patient from [place] to [facility] for acute intervention. CT was positive for hyperdense Left MCA M1 compatible with emergent large vessel occlusion and no hemorrhage or changes of acute infarct are identified. Emergent consent was signed by Dr [name] and myself for endovascular revascularization.   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 Pt's oral phase with ice chips is more timely with swallow initiation felt timelier than with thin water via spoon. With trials of thin water via spoon, pt with 6 to 7 second oral phase and multiple swallows, placing him at higher risk of aspiration. With trials of applesauce, pt with consistent nonfunctional anterior lingual motor pattern - suspicious for apraxia. Pt's oral phase deficits are likely impacted by both dysphagia, apraxia and cognitive attention to bolus. Pt's vitals remained stable but is not able to follow compenstaory swallow strategies to decreased oral holding with applesauce or thin water. Recommend ice chips via spoon with nursing following oral care. Education provided to family and nursing. ST to follow acutely.   SLP Visit Diagnosis: Dysphagia, oropharyngeal phase (R13.12)    Aspiration Risk  Severe aspiration risk;Risk for inadequate nutrition/hydration    Diet Recommendation NPO;Ice chips PRN after oral care   Medication Administration: Via alternative means Supervision: Full supervision/cueing for compensatory strategies;Staff to assist  with self feeding Compensations: Minimize environmental distractions;Slow rate;Small sips/bites Postural Changes: Seated upright at 90 degrees    Other  Recommendations Oral Care Recommendations: Oral care  QID;Oral care prior to ice chip/H20 Other Recommendations: Prohibited food (jello, ice cream, thin soups);Remove water pitcher;Have oral suction available   Follow up Recommendations (TBD)      Frequency and Duration min 2x/week  2 weeks       Prognosis Prognosis for Safe Diet Advancement: Fair Barriers to Reach Goals: Cognitive deficits;Language deficits;Severity of deficits      Swallow Study   General Date of Onset: 06/02/18 HPI: Logan Conner is a 84 y.o. male with past medical history of hyperlipidemia, hypertension, CKD stage III.  Most of the information was obtained from EMS as the wife has not arrived at this time and the incident needed emergent attention.  Attempts were made to call the wife however we only had the phone number of her home.  Per EMS patient was fully active prior to this event.  Patient was apparently reading a magazine flipping through some pages at 1315 when suddenly wife noted that he became very unresponsive with a left gaze.  EMS arrived and noticed that this was a code stroke and drove patient from [place] to [facility] for acute intervention. CT was positive for hyperdense Left MCA M1 compatible with emergent large vessel occlusion and no hemorrhage or changes of acute infarct are identified. Emergent consent was signed by Dr [name] and myself for endovascular revascularization. Type of Study: Bedside Swallow Evaluation Previous Swallow Assessment: none in chart Diet Prior to this Study: NPO Temperature Spikes Noted: No Respiratory Status: Room air History of Recent Intubation: No Behavior/Cognition: Alert;Requires cueing;Doesn't follow directions(globally aphasic and perhaps apraxic) Oral Cavity Assessment: Within Functional Limits Oral Care Completed by SLP: Recent completion by staff Oral Cavity - Dentition: Adequate natural dentition Self-Feeding Abilities: Total assist Patient Positioning: Upright in bed Baseline Vocal Quality: Not  observed Volitional Cough: Cognitively unable to elicit Volitional Swallow: Unable to elicit    Oral/Motor/Sensory Function Overall Oral Motor/Sensory Function: Moderate impairment(unable to follow directions/imitate movements)   Ice Chips Ice chips: Impaired Presentation: Spoon Oral Phase Functional Implications: Right anterior spillage;Prolonged oral transit Pharyngeal Phase Impairments: Suspected delayed Swallow   Thin Liquid Thin Liquid: Impaired Presentation: Spoon;Cup Oral Phase Impairments: Reduced labial seal;Reduced lingual movement/coordination Oral Phase Functional Implications: Right anterior spillage;Prolonged oral transit;Oral holding Pharyngeal  Phase Impairments: Suspected delayed Swallow;Multiple swallows    Nectar Thick Nectar Thick Liquid: Not tested   Honey Thick Honey Thick Liquid: Not tested   Puree Puree: Impaired Presentation: Spoon Oral Phase Impairments: Reduced lingual movement/coordination;Poor awareness of bolus Oral Phase Functional Implications: Oral residue;Prolonged oral transit;Oral holding Pharyngeal Phase Impairments: Suspected delayed Swallow;Multiple swallows   Solid   GO   Solid: Not tested        Logan Conner 06/03/2018,11:29 AM

## 2018-06-03 NOTE — Progress Notes (Signed)
1000 am 73fr RFA sheath removed using 62fr Exoseal device.  Manual pressure applied.  Hemostasis at 1015am.  No hematoma.  Distal pulses intact.  Site reviewed with Harriette Bouillon at bedside.

## 2018-06-03 NOTE — Progress Notes (Addendum)
STROKE TEAM PROGRESS NOTE  HPI: ( Dr Aroor) Logan Conner is a 84 y.o. male with past medical history of hyperlipidemia, hypertension, CKD stage III.  Most of the information was obtained from EMS as the wife has not arrived at this time and the incident needed emergent attention.  Attempts were made to call the wife however we only had the phone number of her home.  Per EMS patient was fully active prior to this event.  Patient was apparently reading a magazine flipping through some pages at 1315 when suddenly wife noted that he became very unresponsive with a left gaze.  EMS arrived and noticed that this was a code stroke and drove patient from [place] to [facility] for acute intervention. NIHSS was 28 on arrival. CT head was negative for hemorhage. CTA of head and neck was obtained showing a left MCA cutoff.  tPA was administered and patient was immediately taken to interventional radiology for acute intervention.  Unfortunately patient is mute at this time and no information other than above can be gathered.  LKW: 1315 on 06/02/2018 tpa given?:  Yes and then patient was sent to interventional radiology Premorbid modified Rankin scale (mRS): 0 NIHSS: 28   INTERVAL HISTORY His 2 daughters and wife are at the bedside.  He is lying in the bed, eyes open but decreased response. Stable from post IR standpoint. Attempting to speak, flaccid R HP.  Blood pressure is adequately controlled on Cardene drip. Neurologically has remained stable overnight. He still has right groin arterial sheath. MRI scan is pending.  Daughter in [place] husband is an Administrator, [employer] in [place] - Dr. Ouida Sills.   Vitals:   06/03/18 0700 06/03/18 0715 06/03/18 0730 06/03/18 0800  BP: 117/61  (!) 113/56 (!) 111/59  Pulse: 81 77 82 81  Resp: (!) 26 (!) 22 (!) 26 (!) 25  Temp:      TempSrc:      SpO2: 97% 98% 99% 95%  Weight:      Height:        CBC:  Recent Labs  Lab 06/02/18 1347 06/02/18 1353 06/03/18 0344   WBC 8.2  --  12.0*  NEUTROABS 5.3  --  9.9*  HGB 13.1 12.2* 11.9*  HCT 41.7 36.0* 36.5*  MCV 102.2*  --  97.6  PLT 155  --  161    Basic Metabolic Panel:  Recent Labs  Lab 06/02/18 1347 06/02/18 1353 06/03/18 0344  NA 142 144 143  K 3.8 3.7 3.8  CL 110 109 114*  CO2 24  --  22  GLUCOSE 135* 130* 129*  BUN 24* 27* 21  CREATININE 1.11 1.20 0.94  CALCIUM 9.2  --  8.7*   Lipid Panel:     Component Value Date/Time   CHOL 124 06/03/2018 0344   TRIG 76 06/03/2018 0344   HDL 49 06/03/2018 0344   CHOLHDL 2.5 06/03/2018 0344   VLDL 15 06/03/2018 0344   LDLCALC 60 06/03/2018 0344   HgbA1c:  Lab Results  Component Value Date   HGBA1C 6.0 (H) 06/03/2018   Urine Drug Screen: No results found for: LABOPIA, COCAINSCRNUR, LABBENZ, AMPHETMU, THCU, LABBARB  Alcohol Level No results found for: ETH  IMAGING Ct Angio Head W Or Wo Contrast  Result Date: 06/02/2018 CLINICAL DATA:  84 year old male with right side weakness, aphasia. Evidence of left MCA LVO on noncontrast head CT. EXAM: CT ANGIOGRAPHY HEAD AND NECK TECHNIQUE: Multidetector CT imaging of the head and neck was performed  using the standard protocol during bolus administration of intravenous contrast. Multiplanar CT image reconstructions and MIPs were obtained to evaluate the vascular anatomy. Carotid stenosis measurements (when applicable) are obtained utilizing NASCET criteria, using the distal internal carotid diameter as the denominator. CONTRAST:  24mL ISOVUE-370 IOPAMIDOL (ISOVUE-370) INJECTION 76% COMPARISON:  Head CT without contrast 1358 hours today. FINDINGS: CT HEAD Reported separately. CTA NECK Skeleton: Motion artifact about the mandible. Widespread severe cervical spine degeneration. Osteopenia. No acute osseous abnormality identified. Upper chest: Negative lung apices. No superior mediastinal lymphadenopathy. Other neck: Negative. No neck mass or lymphadenopathy. Aortic arch: 3 vessel arch configuration. Moderate  Calcified aortic atherosclerosis. Right carotid system: No brachiocephalic or right CCA origin stenosis. Widely patent right carotid bifurcation and cervical right ICA. Left carotid system: No left CCA origin stenosis despite mild soft and calcified plaque. Mildly tortuous left CCA. Calcified plaque at the left ICA origin but widely patent proximal left ICA. No cervical left ICA stenosis or significant tortuosity. Vertebral arteries: No proximal right subclavian artery stenosis despite calcified plaque. Normal right vertebral artery origin. The right vertebral artery appears mildly dominant and normal to the skull base. No proximal left subclavian artery stenosis despite calcified plaque. Normal left vertebral artery origin. The left vertebral is mildly non dominant and patent to the skull base without stenosis. CTA HEAD Posterior circulation: Mild right Vertebral artery V4 calcified plaque without stenosis. No distal left vertebral artery stenosis. Tortuous distal vertebral arteries. Normal PICA origins. Patent vertebrobasilar junction and basilar artery without stenosis. Normal SCA and PCA origins. Posterior communicating arteries are diminutive or absent. Left PCA branches are within normal limits. There is a moderate to severe stenosis of the right PCA P2 segment, but the remaining right PCA branches are within normal limits (series 12, image 19). Anterior circulation: Both ICA siphons are patent with predominantly mild calcified plaque. No left patent left ICA terminus. There is mild to moderate right ICA supraclinoid segment stenosis due to calcified plaque. Patent right ICA terminus. The left MCA M1 segment is occluded about 10 millimeters beyond its origin just distal to the origin of a small left anterior temporal artery. There is very little early arterial phase collateral flow in the left MCA branches. The left ACA A1 segment is dominant and normal. Anterior communicating artery is mildly ectatic without  aneurysm. Proximal ACA A2 branches are tortuous. Other ACA branches are within normal limits. Right MCA M1 segment and right MCA bifurcation are patent. Right MCA branches are within normal limits. Siphon stenosis. Venous sinuses: Not well evaluated due to early contrast timing. Anatomic variants: Mildly dominant right vertebral artery. Dominant left ACA A1 segment. Review of the MIP images confirms the above findings IMPRESSION: 1. Positive for Left MCA mid M1 emergent large vessel occlusion. No significant collateral flow on these early arterial phase images. This result was communicated to Dr. [name] at at [place] hours by text page via the [facility] messaging system. 2. No left carotid stenosis and no other arterial stenosis in the neck. 3. There is a moderate to severe stenosis of the right PCA P2 segment. Otherwise negative posterior circulation. 4.  Aortic Atherosclerosis (ICD10-I70.0). Electronically Signed   By: Genevie Ann M.D.   On: 06/02/2018 14:21   Ct Angio Neck W Or Wo Contrast  Result Date: 06/02/2018 CLINICAL DATA:  84 year old male with right side weakness, aphasia. Evidence of left MCA LVO on noncontrast head CT. EXAM: CT ANGIOGRAPHY HEAD AND NECK TECHNIQUE: Multidetector CT imaging of the head and  neck was performed using the standard protocol during bolus administration of intravenous contrast. Multiplanar CT image reconstructions and MIPs were obtained to evaluate the vascular anatomy. Carotid stenosis measurements (when applicable) are obtained utilizing NASCET criteria, using the distal internal carotid diameter as the denominator. CONTRAST:  30mL ISOVUE-370 IOPAMIDOL (ISOVUE-370) INJECTION 76% COMPARISON:  Head CT without contrast 1358 hours today. FINDINGS: CT HEAD Reported separately. CTA NECK Skeleton: Motion artifact about the mandible. Widespread severe cervical spine degeneration. Osteopenia. No acute osseous abnormality identified. Upper chest: Negative lung apices. No superior mediastinal  lymphadenopathy. Other neck: Negative. No neck mass or lymphadenopathy. Aortic arch: 3 vessel arch configuration. Moderate Calcified aortic atherosclerosis. Right carotid system: No brachiocephalic or right CCA origin stenosis. Widely patent right carotid bifurcation and cervical right ICA. Left carotid system: No left CCA origin stenosis despite mild soft and calcified plaque. Mildly tortuous left CCA. Calcified plaque at the left ICA origin but widely patent proximal left ICA. No cervical left ICA stenosis or significant tortuosity. Vertebral arteries: No proximal right subclavian artery stenosis despite calcified plaque. Normal right vertebral artery origin. The right vertebral artery appears mildly dominant and normal to the skull base. No proximal left subclavian artery stenosis despite calcified plaque. Normal left vertebral artery origin. The left vertebral is mildly non dominant and patent to the skull base without stenosis. CTA HEAD Posterior circulation: Mild right Vertebral artery V4 calcified plaque without stenosis. No distal left vertebral artery stenosis. Tortuous distal vertebral arteries. Normal PICA origins. Patent vertebrobasilar junction and basilar artery without stenosis. Normal SCA and PCA origins. Posterior communicating arteries are diminutive or absent. Left PCA branches are within normal limits. There is a moderate to severe stenosis of the right PCA P2 segment, but the remaining right PCA branches are within normal limits (series 12, image 19). Anterior circulation: Both ICA siphons are patent with predominantly mild calcified plaque. No left patent left ICA terminus. There is mild to moderate right ICA supraclinoid segment stenosis due to calcified plaque. Patent right ICA terminus. The left MCA M1 segment is occluded about 10 millimeters beyond its origin just distal to the origin of a small left anterior temporal artery. There is very little early arterial phase collateral flow in the  left MCA branches. The left ACA A1 segment is dominant and normal. Anterior communicating artery is mildly ectatic without aneurysm. Proximal ACA A2 branches are tortuous. Other ACA branches are within normal limits. Right MCA M1 segment and right MCA bifurcation are patent. Right MCA branches are within normal limits. Siphon stenosis. Venous sinuses: Not well evaluated due to early contrast timing. Anatomic variants: Mildly dominant right vertebral artery. Dominant left ACA A1 segment. Review of the MIP images confirms the above findings IMPRESSION: 1. Positive for Left MCA mid M1 emergent large vessel occlusion. No significant collateral flow on these early arterial phase images. This result was communicated to Dr. [name] at at [place] hours by text page via the [facility] messaging system. 2. No left carotid stenosis and no other arterial stenosis in the neck. 3. There is a moderate to severe stenosis of the right PCA P2 segment. Otherwise negative posterior circulation. 4.  Aortic Atherosclerosis (ICD10-I70.0). Electronically Signed   By: Genevie Ann M.D.   On: 06/02/2018 14:21   Ct Head Code Stroke Wo Contrast  Result Date: 06/02/2018 CLINICAL DATA:  Code stroke. 84 year old male with right side weakness, aphasia. EXAM: CT HEAD WITHOUT CONTRAST TECHNIQUE: Contiguous axial images were obtained from the base of the skull through the  vertex without intravenous contrast. COMPARISON:  No prior brain imaging. FINDINGS: Brain: No changes of acute cortically based infarct identified. Gray-white matter differentiation appears maintained in the left hemisphere. No acute intracranial hemorrhage identified. No midline shift, mass effect, or evidence of intracranial mass lesion. No ventriculomegaly. No cortical encephalomalacia identified, normal white matter and deep gray matter nuclei appearance for age. Vascular: Calcified atherosclerosis at the skull base. Conspicuous abnormal left MCA M1 hyperdensity (series 5, image 32).  Skull: No acute osseous abnormality identified. Sinuses/Orbits: Minor bubbly opacity in the left sphenoid. Otherwise the visible paranasal sinuses are well pneumatized. The tympanic cavities and mastoids are clear. Other: There is some leftward gaze deviation. No other acute orbit or scalp soft tissue finding. ASPECTS [facility] Stroke Program Early CT Score) - Ganglionic level infarction (caudate, lentiform nuclei, internal capsule, insula, M1-M3 cortex): 7 - Supraganglionic infarction (M4-M6 cortex): 3 Total score (0-10 with 10 being normal): 10 IMPRESSION: 1. Positive for hyperdense Left MCA M1 compatible with Emergent Large Vessel Occlusion. 2. No hemorrhage or changes of acute infarct are identified. ASPECTS 10. 3. These results were communicated to Dr. [name] at 2:01 pmon 7/23/2019by text page via the [facility] messaging system. Electronically Signed   By: Genevie Ann M.D.   On: 06/02/2018 14:02   Cerebral angio 06/02/2018 S/p Lt common carotid arteriogram followed by complete revascularization of Lt MCA M 1 occlusion with x 1 pass with embotrap  51mm x 33 mm retriever device achieving a TICI 3 revascularization    PHYSICAL EXAM Frail elderly Caucasian male not in distress. . Afebrile. Head is nontraumatic. Neck is supple without bruit.    Cardiac exam no murmur or gallop. Lungs are clear to auscultation. Distal pulses are well felt.has right groin arterial sheath  Neurological Exam :  Awake alert globally aphasic and mute. Has left gaze and head deviation. Will not follow gaze to the right. Blinks to threat on the left but not the right. Pupils irregular 3 mm reactive. Corneal reflexes present. Fundi not visualized. Right lower facial weakness. Tongue midline. Flaccid right hemiplegia but will withdraw right lower extremity well and right upper extremity trace to painful stimuli. Purposeful antigravity movements on the left side. Tone is diminished on the right and normal on the left. Reflexes diminished on the  right and normal on the left. Right plantar upgoing left downgoing. ASSESSMENT/PLAN Mr. LY BACCHI is a 84 y.o. male with history of HTN, HLD and CKD stage III presenting with L gaze, mute and unresponsive. Given tPA 06/02/2018 at 1403. Found to have a L M1 occlusion and taken to IR. Post IR TICI3 revascularization.   Stroke:  left MCA infarct s/p IV tPA and IR w/ TICI3 reperfusion of L M1 occlusion, infarct embolic secondary to unknown source  R groin sheath remains in - to be removed  Code Stroke CT head Hyperdense L MCA M1 w/ ELVO, no hmg or acute stroke seen. ASPECTS 10.     CTA head & neck L MCA mid M1 ELVO. Mod stenosis R PCA P2. Aortic atherosclerosis.  Cerebral angio TICI3 reperfusion of L M1 occlusion with 1 pass embotrap  MRI  pending   2D Echo  pending   LDL 60  HgbA1c 6.0  SCDs for VTE prophylaxis Diet Order           Diet NPO time specified  Diet effective now           No antithrombotic prior to admission, now on No antithrombotic as within  24h of tPA  Therapy recommendations:  Pending. Currently on bedrest with sheath  Disposition:  pending - independent PTA, still driving. anticipate SNF placement. Lived w/ wife PTA who has cognitive deficits and unable to care for self - she had [place] care 3d/week. dtrs aware of global plan.  Hypertension  Elevated post IR  SBP goal per IR x 24h  On cleviprex drip . Long-term BP goal normotensive  Hyperlipidemia  Home meds:  zocor 40 mg daily  LDL 60, goal < 70  Resume statin once able to swallow  Continue statin at discharge  Other Stroke Risk Factors  Advanced age  Other Active Problems  CKD stage III  Hospital day # [place], MSN, APRN, ANVP-BC, AGPCNP-BC [facility] Stroke Nurse [place] for Schedule & Pager information 06/03/2018 9:29 AM  I have personally examined this patient, reviewed notes, independently viewed imaging studies, participated in  medical decision making and plan of care.ROS completed by me personally and pertinent positives fully documented  I have made any additions or clarifications directly to the above note. Agree with note above.  Patient presented with global aphasia and right hemiplegia due to left M1 occlusion and received IV tPA followed by mechanical thrombectomy with complete recanalization. Recommend close neurological follow-up and strict blood pressure control as per post TPA and intervention protocol. Discontinue groin arterial sheath. Mobilize out of bed after that. Check MRI scan of the brain dated today. Start antiplatelet therapy if no hemorrhage. Long discussion with the patient's wife and daughters at the bedside and answered questions about his care. Discussed with Dr. Estanislado Pandy.This patient is critically ill and at significant risk of neurological worsening, death and care requires constant monitoring of vital signs, hemodynamics,respiratory and cardiac monitoring, extensive review of multiple databases, frequent neurological assessment, discussion with family, other specialists and medical decision making of high complexity.I have made any additions or clarifications directly to the above note.This critical care time does not reflect procedure time, or teaching time or supervisory time of PA/NP/Med Resident etc but could involve care discussion time.  I spent 40 minutes of neurocritical care time  in the care of  this patient.      Antony Contras, MD Medical Director [facility] Pager: [phone] 06/03/2018 3:13 PM  To contact Stroke Continuity provider, please refer to [email or web address] After hours, contact General Neurology

## 2018-06-03 NOTE — Consult Note (Signed)
[place] Nurse wound consult note Reason for Consult: Consult requested for buttock wound.  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  Foam dressing to protect from further injury.  Pt is on a low air loss bed to reduce pressure.   Discussed plan of care with family. Please re-consult if further assistance is needed.  Thank-you,  Julien Girt MSN, [place], [place], [place], [place]

## 2018-06-03 NOTE — Progress Notes (Signed)
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 Estanislado Pandy. Patient awake and alert laying in bed. Can spontaneously move left extremities and wiggle right toes, no spontaneous movements of right arm. Grossly aphasic. Left gaze deviation. Right groin incision c/d/i with sheath intact.   Allergies: Patient has no known allergies.  Medications: Prior to Admission medications   Medication Sig Start Date End Date Taking? Authorizing Provider  acetaminophen (TYLENOL) 650 MG CR tablet Take 1,300 mg by mouth every 8 (eight) hours as needed for pain.   Yes [provider]  bimatoprost (LUMIGAN) 0.01 % SOLN Place 1 drop into both eyes at bedtime.  04/21/14  Yes [provider]  brimonidine (ALPHAGAN P) 0.1 % SOLN Apply 1 drop to eye 3 (three) times daily.    Yes [provider]  budesonide-formoterol (SYMBICORT) 160-4.5 MCG/ACT inhaler Inhale 2 puffs into the lungs 2 (two) times daily.    Yes [provider]  finasteride (PROSCAR) 5 MG tablet Take 1 tablet (5 mg total) by mouth daily. 03/16/18 03/16/19 Yes Festus Aloe, MD  hydroxychloroquine (PLAQUENIL) 200 MG tablet TAKE 1 TABLET BY MOUTH ONCE DAILY. 02/26/16  Yes [provider]  levothyroxine (SYNTHROID) 50 MCG tablet TAKE 1 TABLET BY MOUTH EVERY MORNING ON AN EMPTY STOMACH. AT LEAST 30-60 [place], WITH A GLASS OF WATER 08/12/16  Yes [provider]  metoprolol succinate (TOPROL-XL) 50 MG 24 hr tablet TAKE ONE TABLET BY MOUTH ONCE DAILY 04/30/16  Yes [provider]  montelukast (SINGULAIR) 10 MG tablet Take 10 mg by mouth at bedtime.  05/16/16  Yes [provider]  Multiple Vitamins-Minerals (CEROVITE SENIOR) TABS Take 1 tablet by mouth daily.   Yes [provider]    olmesartan (BENICAR) 40 MG tablet TAKE 1 TABLET BY MOUTH ONCE DAILY. 01/29/16  Yes [provider]  simvastatin (ZOCOR) 40 MG tablet Take 40 mg by mouth daily at 6 PM.   Yes [provider]  tamsulosin (FLOMAX) 0.4 MG CAPS capsule Take 1 capsule (0.4 mg total) by mouth daily after supper. 08/20/17  Yes Festus Aloe, MD     Vital Signs: BP 116/60   Pulse 81   Temp 99.4 F (37.4 C) (Axillary)   Resp (!) 27   Ht 6' (1.829 m)   Wt 151 lb 10.8 oz (68.8 kg)   SpO2 99%   BMI 20.57 kg/m   Physical Exam  Constitutional: He appears well-developed and well-nourished. No distress.  Cardiovascular: Normal rate, regular rhythm and normal heart sounds.  No murmur heard. Pulmonary/Chest: Effort normal and breath sounds normal. No respiratory distress. He has no wheezes.  Neurological:  Alert and awake, eyes follow voice but does not follow commands. Grossly aphasic. PERRL bilaterally. Demonstrates left gaze preference. Visual fields not assessed. No facial asymmetry. Tongue midline. Can spontaneously move left extremities and wiggle right toes, no spontaneous movements of right arm. Pronator drift not assessed. Fine motor and coordination not assessed. Gait not assessed. Romberg not assessed. Heel to toe not assessed. Distal pulses palpable by doppler bilaterally.  Skin: Skin is warm and dry.  Right groin incision soft with sheath in place, no hematoma or active bleeding.  Psychiatric: He has a normal mood and affect. His behavior is normal. Judgment and thought content normal.  Nursing note and vitals reviewed.   Imaging: Ct Angio  Head W Or Wo Contrast  Result Date: 06/02/2018 CLINICAL DATA:  84 year old male with right side weakness, aphasia. Evidence of left MCA LVO on noncontrast head CT. EXAM: CT ANGIOGRAPHY HEAD AND NECK TECHNIQUE: Multidetector CT imaging of the head and neck was performed using the standard protocol during bolus administration of  intravenous contrast. Multiplanar CT image reconstructions and MIPs were obtained to evaluate the vascular anatomy. Carotid stenosis measurements (when applicable) are obtained utilizing NASCET criteria, using the distal internal carotid diameter as the denominator. CONTRAST:  49mL ISOVUE-370 IOPAMIDOL (ISOVUE-370) INJECTION 76% COMPARISON:  Head CT without contrast 1358 hours today. FINDINGS: CT HEAD Reported separately. CTA NECK Skeleton: Motion artifact about the mandible. Widespread severe cervical spine degeneration. Osteopenia. No acute osseous abnormality identified. Upper chest: Negative lung apices. No superior mediastinal lymphadenopathy. Other neck: Negative. No neck mass or lymphadenopathy. Aortic arch: 3 vessel arch configuration. Moderate Calcified aortic atherosclerosis. Right carotid system: No brachiocephalic or right CCA origin stenosis. Widely patent right carotid bifurcation and cervical right ICA. Left carotid system: No left CCA origin stenosis despite mild soft and calcified plaque. Mildly tortuous left CCA. Calcified plaque at the left ICA origin but widely patent proximal left ICA. No cervical left ICA stenosis or significant tortuosity. Vertebral arteries: No proximal right subclavian artery stenosis despite calcified plaque. Normal right vertebral artery origin. The right vertebral artery appears mildly dominant and normal to the skull base. No proximal left subclavian artery stenosis despite calcified plaque. Normal left vertebral artery origin. The left vertebral is mildly non dominant and patent to the skull base without stenosis. CTA HEAD Posterior circulation: Mild right Vertebral artery V4 calcified plaque without stenosis. No distal left vertebral artery stenosis. Tortuous distal vertebral arteries. Normal PICA origins. Patent vertebrobasilar junction and basilar artery without stenosis. Normal SCA and PCA origins. Posterior communicating arteries are diminutive or absent. Left PCA  branches are within normal limits. There is a moderate to severe stenosis of the right PCA P2 segment, but the remaining right PCA branches are within normal limits (series 12, image 19). Anterior circulation: Both ICA siphons are patent with predominantly mild calcified plaque. No left patent left ICA terminus. There is mild to moderate right ICA supraclinoid segment stenosis due to calcified plaque. Patent right ICA terminus. The left MCA M1 segment is occluded about 10 millimeters beyond its origin just distal to the origin of a small left anterior temporal artery. There is very little early arterial phase collateral flow in the left MCA branches. The left ACA A1 segment is dominant and normal. Anterior communicating artery is mildly ectatic without aneurysm. Proximal ACA A2 branches are tortuous. Other ACA branches are within normal limits. Right MCA M1 segment and right MCA bifurcation are patent. Right MCA branches are within normal limits. Siphon stenosis. Venous sinuses: Not well evaluated due to early contrast timing. Anatomic variants: Mildly dominant right vertebral artery. Dominant left ACA A1 segment. Review of the MIP images confirms the above findings IMPRESSION: 1. Positive for Left MCA mid M1 emergent large vessel occlusion. No significant collateral flow on these early arterial phase images. This result was communicated to Dr. [name] at at [place] hours by text page via the [facility] messaging system. 2. No left carotid stenosis and no other arterial stenosis in the neck. 3. There is a moderate to severe stenosis of the right PCA P2 segment. Otherwise negative posterior circulation. 4.  Aortic Atherosclerosis (ICD10-I70.0). Electronically Signed   By: Genevie Ann M.D.   On: 06/02/2018 14:21  Ct Angio Neck W Or Wo Contrast  Result Date: 06/02/2018 CLINICAL DATA:  84 year old male with right side weakness, aphasia. Evidence of left MCA LVO on noncontrast head CT. EXAM: CT ANGIOGRAPHY HEAD AND NECK  TECHNIQUE: Multidetector CT imaging of the head and neck was performed using the standard protocol during bolus administration of intravenous contrast. Multiplanar CT image reconstructions and MIPs were obtained to evaluate the vascular anatomy. Carotid stenosis measurements (when applicable) are obtained utilizing NASCET criteria, using the distal internal carotid diameter as the denominator. CONTRAST:  64mL ISOVUE-370 IOPAMIDOL (ISOVUE-370) INJECTION 76% COMPARISON:  Head CT without contrast 1358 hours today. FINDINGS: CT HEAD Reported separately. CTA NECK Skeleton: Motion artifact about the mandible. Widespread severe cervical spine degeneration. Osteopenia. No acute osseous abnormality identified. Upper chest: Negative lung apices. No superior mediastinal lymphadenopathy. Other neck: Negative. No neck mass or lymphadenopathy. Aortic arch: 3 vessel arch configuration. Moderate Calcified aortic atherosclerosis. Right carotid system: No brachiocephalic or right CCA origin stenosis. Widely patent right carotid bifurcation and cervical right ICA. Left carotid system: No left CCA origin stenosis despite mild soft and calcified plaque. Mildly tortuous left CCA. Calcified plaque at the left ICA origin but widely patent proximal left ICA. No cervical left ICA stenosis or significant tortuosity. Vertebral arteries: No proximal right subclavian artery stenosis despite calcified plaque. Normal right vertebral artery origin. The right vertebral artery appears mildly dominant and normal to the skull base. No proximal left subclavian artery stenosis despite calcified plaque. Normal left vertebral artery origin. The left vertebral is mildly non dominant and patent to the skull base without stenosis. CTA HEAD Posterior circulation: Mild right Vertebral artery V4 calcified plaque without stenosis. No distal left vertebral artery stenosis. Tortuous distal vertebral arteries. Normal PICA origins. Patent vertebrobasilar junction and  basilar artery without stenosis. Normal SCA and PCA origins. Posterior communicating arteries are diminutive or absent. Left PCA branches are within normal limits. There is a moderate to severe stenosis of the right PCA P2 segment, but the remaining right PCA branches are within normal limits (series 12, image 19). Anterior circulation: Both ICA siphons are patent with predominantly mild calcified plaque. No left patent left ICA terminus. There is mild to moderate right ICA supraclinoid segment stenosis due to calcified plaque. Patent right ICA terminus. The left MCA M1 segment is occluded about 10 millimeters beyond its origin just distal to the origin of a small left anterior temporal artery. There is very little early arterial phase collateral flow in the left MCA branches. The left ACA A1 segment is dominant and normal. Anterior communicating artery is mildly ectatic without aneurysm. Proximal ACA A2 branches are tortuous. Other ACA branches are within normal limits. Right MCA M1 segment and right MCA bifurcation are patent. Right MCA branches are within normal limits. Siphon stenosis. Venous sinuses: Not well evaluated due to early contrast timing. Anatomic variants: Mildly dominant right vertebral artery. Dominant left ACA A1 segment. Review of the MIP images confirms the above findings IMPRESSION: 1. Positive for Left MCA mid M1 emergent large vessel occlusion. No significant collateral flow on these early arterial phase images. This result was communicated to Dr. [name] at at [place] hours by text page via the [facility] messaging system. 2. No left carotid stenosis and no other arterial stenosis in the neck. 3. There is a moderate to severe stenosis of the right PCA P2 segment. Otherwise negative posterior circulation. 4.  Aortic Atherosclerosis (ICD10-I70.0). Electronically Signed   By: Genevie Ann M.D.   On: 06/02/2018  14:21   Ct Head Code Stroke Wo Contrast  Result Date: 06/02/2018 CLINICAL DATA:  Code stroke.  84 year old male with right side weakness, aphasia. EXAM: CT HEAD WITHOUT CONTRAST TECHNIQUE: Contiguous axial images were obtained from the base of the skull through the vertex without intravenous contrast. COMPARISON:  No prior brain imaging. FINDINGS: Brain: No changes of acute cortically based infarct identified. Gray-white matter differentiation appears maintained in the left hemisphere. No acute intracranial hemorrhage identified. No midline shift, mass effect, or evidence of intracranial mass lesion. No ventriculomegaly. No cortical encephalomalacia identified, normal white matter and deep gray matter nuclei appearance for age. Vascular: Calcified atherosclerosis at the skull base. Conspicuous abnormal left MCA M1 hyperdensity (series 5, image 32). Skull: No acute osseous abnormality identified. Sinuses/Orbits: Minor bubbly opacity in the left sphenoid. Otherwise the visible paranasal sinuses are well pneumatized. The tympanic cavities and mastoids are clear. Other: There is some leftward gaze deviation. No other acute orbit or scalp soft tissue finding. ASPECTS [facility] Stroke Program Early CT Score) - Ganglionic level infarction (caudate, lentiform nuclei, internal capsule, insula, M1-M3 cortex): 7 - Supraganglionic infarction (M4-M6 cortex): 3 Total score (0-10 with 10 being normal): 10 IMPRESSION: 1. Positive for hyperdense Left MCA M1 compatible with Emergent Large Vessel Occlusion. 2. No hemorrhage or changes of acute infarct are identified. ASPECTS 10. 3. These results were communicated to Dr. [name] at 2:01 pmon 7/23/2019by text page via the [facility] messaging system. Electronically Signed   By: Genevie Ann M.D.   On: 06/02/2018 14:02    Labs:  CBC: Recent Labs    06/10/17 0141 06/02/18 1347 06/02/18 1353 06/03/18 0344  WBC 12.1* 8.2  --  12.0*  HGB 14.6 13.1 12.2* 11.9*  HCT 42.9 41.7 36.0* 36.5*  PLT 204 155  --  154    COAGS: Recent Labs    06/02/18 1347  INR 1.08  APTT 34     BMP: Recent Labs    06/10/17 0141  03/11/18 1411 06/02/18 1347 06/02/18 1353 06/03/18 0344  NA 141  --   --  142 144 143  K 4.6  --   --  3.8 3.7 3.8  CL 108  --   --  110 109 114*  CO2 23  --   --  24  --  22  GLUCOSE 150*  --   --  135* 130* 129*  BUN 27*  --   --  24* 27* 21  CALCIUM 9.5  --   --  9.2  --  8.7*  CREATININE 0.99   < > 1.00 1.11 1.20 0.94  GFRNONAA >60  --   --  56*  --  >60  GFRAA >60  --   --  >60  --  >60   < > = values in this interval not displayed.    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 Estanislado Pandy. Patient's condition stable- can spontaneously move left extremities and wiggle right toes, no spontaneous movements of right arm, grossly aphasic, and left gaze deviation. Right groin stable- sheath to be removed by IR tech this AM. Appreciate and agree with neurology management.   Electronically Signed: Earley Abide, PA-C 06/03/2018, 9:06 AM   I spent a total of 15 Minutes at the the patient's bedside  AND on the patient's hospital floor or unit, greater than 50% of which was counseling/coordinating care for left MCA M1 segment occlusion.

## 2018-06-03 NOTE — Evaluation (Signed)
Occupational Therapy Evaluation Patient Details Name: Logan Conner MRN: [id number] DOB: 08-18-1925 Today's Date: 06/03/2018    History of Present Illness Logan Conner a 84 y.o.malewith past medical history of hyperlipidemia, hypertension, CKD stage III. Presented to ED code stroke. tPA was administered. CT was positive for hyperdense Left MCA M1 compatible with emergent large vessel occlusion and no hemorrhage or changes of acute infarct are identified. S/p Lt common carotid arteriogram followed by complete revascularization of Lt MCA M 1 occlusion MRI revealed Acute left MCA and distal left ACA infarcts.   Clinical Impression   PTA Pt independent in ADL and IADL - primary caregiver for his wife, drives. They did have HHPT/aide coming out to the house but for wife (gleaned from chart review). Pt is currently total A for all aspects of ADL, presents with left gaze preference (will cross midline inconsistently), this session not following commands consistently but seems to have delay when he does respond to commands. Max A +2 to stand, once in standing he was able to maintain with min A +2. Seemingly right inattention and will not follow commands for MMT but will not let arm drop. Pt will benefit from skilled OT in the acute setting as well as afterwards at the SNF level to maximize safety and independence in ADL and functional transfers.     Follow Up Recommendations  SNF;Supervision/Assistance - 24 hour    Equipment Recommendations  Other (comment)(defer to next venue)    Recommendations for Other Services       Precautions / Restrictions Precautions Precautions: Fall Restrictions Weight Bearing Restrictions: No      Mobility Bed Mobility Overal bed mobility: Needs Assistance Bed Mobility: Supine to Sit;Sit to Supine     Supine to sit: Total assist;+2 for physical assistance;+2 for safety/equipment Sit to supine: Total assist;+2 for physical assistance;+2 for  safety/equipment   General bed mobility comments: total +2 for all aspects of bed mobility  Transfers Overall transfer level: Needs assistance Equipment used: 2 person hand held assist Transfers: Sit to/from Stand Sit to Stand: Max assist;+2 physical assistance;+2 safety/equipment;Min assist         General transfer comment: to power up into standing, max A +2; once standing min A +2 to maintain standing    Balance Overall balance assessment: Needs assistance Sitting-balance support: Single extremity supported;Feet supported Sitting balance-Leahy Scale: Poor Sitting balance - Comments: at least mod A to maintain seated balance right lateral lean Postural control: Right lateral lean;Left lateral lean Standing balance support: Bilateral upper extremity supported Standing balance-Leahy Scale: Poor Standing balance comment: dependent on external support, left lateral lean in standing                           ADL either performed or assessed with clinical judgement   ADL Overall ADL's : Needs assistance/impaired                                       General ADL Comments: Pt is total assist at this time for all ADL     Vision   Vision Assessment?: Vision impaired- to be further tested in functional context Additional Comments: left gaze preference, Pt will cross midline with LOUD sharp auditory cues 1/3 times     Perception     Praxis      Pertinent Vitals/Pain  Hand Dominance Right   Extremity/Trunk Assessment Upper Extremity Assessment Upper Extremity Assessment: RUE deficits/detail;Generalized weakness RUE Deficits / Details: not following commands hard to determine strength vs sensation, but will not let hs arm drop RUE Coordination: decreased fine motor;decreased gross motor   Lower Extremity Assessment Lower Extremity Assessment: Defer to PT evaluation   Cervical / Trunk Assessment Cervical / Trunk Assessment: Kyphotic    Communication Communication Communication: Expressive difficulties   Cognition Arousal/Alertness: Awake/alert Behavior During Therapy: Flat affect Overall Cognitive Status: Difficult to assess                                 General Comments: global aphasia? Pt did not communicate throughout session. Pt seemed to follow minimal commands with 15 second delay.   General Comments  no family present during session, VSS throughout    Exercises     Shoulder Instructions      Home Living Family/patient expects to be discharged to:: Skilled nursing facility Living Arrangements: Spouse/significant other(Pt is caregiver for spouse) Available Help at Discharge: Family Type of Home: House                           Additional Comments: Gleaned from chart review.   Lives With: Spouse    Prior Functioning/Environment Level of Independence: Independent        Comments: per RN, pt was caregiver for wife, driving, ambulating without DME        OT Problem List: Decreased strength;Decreased activity tolerance;Impaired balance (sitting and/or standing);Impaired vision/perception;Decreased coordination;Decreased cognition;Decreased safety awareness;Impaired UE functional use      OT Treatment/Interventions: Self-care/ADL training;Therapeutic exercise;DME and/or AE instruction;Manual therapy;Therapeutic activities;Cognitive remediation/compensation;Patient/family education;Visual/perceptual remediation/compensation;Balance training    OT Goals(Current goals can be found in the care plan section) Acute Rehab OT Goals Patient Stated Goal: none stated OT Goal Formulation: Patient unable to participate in goal setting  OT Frequency: Min 2X/week   Barriers to D/C:            Co-evaluation PT/OT/SLP Co-Evaluation/Treatment: Yes Reason for Co-Treatment: Complexity of the patient's impairments (multi-system involvement);Necessary to address cognition/behavior during  functional activity;For patient/therapist safety;To address functional/ADL transfers PT goals addressed during session: Mobility/safety with mobility;Balance OT goals addressed during session: ADL's and self-care;Strengthening/ROM      AM-PAC PT "6 Clicks" Daily Activity     Outcome Measure Help from another person eating meals?: Total Help from another person taking care of personal grooming?: Total Help from another person toileting, which includes using toliet, bedpan, or urinal?: Total Help from another person bathing (including washing, rinsing, drying)?: Total Help from another person to put on and taking off regular upper body clothing?: Total Help from another person to put on and taking off regular lower body clothing?: Total 6 Click Score: 6   End of Session Equipment Utilized During Treatment: Gait belt Nurse Communication: Mobility status  Activity Tolerance: Patient tolerated treatment well Patient left: in bed;with call bell/phone within reach;with bed alarm set;with SCD's reapplied  OT Visit Diagnosis: Unsteadiness on feet (R26.81);Other abnormalities of gait and mobility (R26.89);Muscle weakness (generalized) (M62.81);Other symptoms and signs involving the nervous system (R29.898);Other symptoms and signs involving cognitive function;Cognitive communication deficit (R41.841);Hemiplegia and hemiparesis Symptoms and signs involving cognitive functions: Cerebral infarction Hemiplegia - Right/Left: Right Hemiplegia - dominant/non-dominant: Dominant Hemiplegia - caused by: Cerebral infarction  Time: 7408-1448 OT Time Calculation (min): 30 min Charges:  OT General Charges $OT Visit: 1 Visit OT Evaluation $OT Eval Moderate Complexity: 1 Mod G-Codes:     {Erianna Jolly OTR/L [place] 06/03/2018, 4:23 PM

## 2018-06-03 NOTE — Evaluation (Signed)
Speech Language Pathology Evaluation Patient Details Name: Logan Conner MRN: [id number] DOB: [birth year] Today's Date: 06/03/2018 Time: 5621-3086 SLP Time Calculation (min) (ACUTE ONLY): 11 min  Problem List:  Patient Active Problem List   Diagnosis Date Noted  . Pressure injury of skin 06/03/2018  . Acute ischemic left MCA stroke ([place]) 06/02/2018  . Middle cerebral artery embolism, left 06/02/2018  . Right hip pain 06/10/2017  . Neurogenic claudication due to lumbar spinal stenosis 06/04/2017   Past Medical History:  Past Medical History:  Diagnosis Date  . Arthritis   . Asthma   . BPH (benign prostatic hyperplasia) 05/03/2014  . Cataract cortical, senile   . CKD (chronic kidney disease), stage III ([place]) 05/03/2014  . Collagen vascular disease (HCC)    RA  . Generalized OA 05/03/2014  . Glaucoma   . Hyperlipidemia   . Hyperlipidemia, unspecified 05/03/2014  . Hypertension 05/03/2014  . Hyperthyroidism   . Kidney stone 05/03/2014   Past Surgical History:  Past Surgical History:  Procedure Laterality Date  . CATARACT EXTRACTION Right   . COLONOSCOPY  02/21/2006  . ESOPHAGOGASTRODUODENOSCOPY  02/21/2006  . JOINT REPLACEMENT Right    total hip  . kidney stone    . LITHOTRIPSY    . LUMBAR LAMINECTOMY/DECOMPRESSION MICRODISCECTOMY Right 06/04/2017   Procedure: LUMBAR LAMINECTOMY/DECOMPRESSION MICRODISCECTOMY 2 LEVELS L4-S1;  Surgeon: Meade Maw, MD;  Location: [facility];  Service: Neurosurgery;  Laterality: Right;  . RADIOLOGY WITH ANESTHESIA N/A 06/02/2018   Procedure: RADIOLOGY WITH ANESTHESIA;  Surgeon: Luanne Bras, MD;  Location: [place];  Service: Radiology;  Laterality: N/A;  . TOTAL HIP ARTHROPLASTY Right    HPI:  ADIEN Conner is a 84 y.o. male with past medical history of hyperlipidemia, hypertension, CKD stage III.  Most of the information was obtained from EMS as the wife has not arrived at this time and the incident needed emergent attention.   Attempts were made to call the wife however we only had the phone number of her home.  Per EMS patient was fully active prior to this event.  Patient was apparently reading a magazine flipping through some pages at 1315 when suddenly wife noted that he became very unresponsive with a left gaze.  EMS arrived and noticed that this was a code stroke and drove patient from [place] to [facility] for acute intervention. CT was positive for hyperdense Left MCA M1 compatible with emergent large vessel occlusion and no hemorrhage or changes of acute infarct are identified. Emergent consent was signed by Dr [name] and myself for endovascular revascularization.   Assessment / Plan / Recommendation Clinical Impression  Pt is globally aphasia as evidenced by no attempts to communicate or imitiate any speech sounds/labeling, no indication of comprehension of spoken language, is intermittently oriented to his name, and possible apraxia of speech as he is unable to imitate any oral movements. Pt demonstrates left gaze preference and likely has some cognitive deficits that will need further assessment once language has improved. Nature of aphasia, apraxia and congition were explained to wife and 2 daughters. POC explained and all questions answered to their satisfaction. ST to follow.    SLP Assessment  SLP Recommendation/Assessment: Patient needs continued Speech Lanaguage Pathology Services SLP Visit Diagnosis: Aphasia (R47.01);Apraxia (R48.2);Cognitive communication deficit (R41.841)    Follow Up Recommendations  (TBD)    Frequency and Duration min 2x/week  2 weeks      SLP Evaluation Cognition  Overall Cognitive Status: Difficult to assess(d/t global aphasia) Arousal/Alertness:  Awake/alert Orientation Level: Oriented to person(intermittently) Attention: Focused Focused Attention: Impaired Focused Attention Impairment: Verbal basic;Functional basic       Comprehension  Auditory  Comprehension Overall Auditory Comprehension: Impaired Yes/No Questions: Impaired Commands: Impaired Visual Recognition/Discrimination Discrimination: Not tested Reading Comprehension Reading Status: Not tested    Expression Expression Primary Mode of Expression: (No speech or use of gestures) Verbal Expression Overall Verbal Expression: Impaired(noncommunicative) Written Expression Dominant Hand: Right Written Expression: Not tested   Oral / Motor  Oral Motor/Sensory Function Overall Oral Motor/Sensory Function: Moderate impairment   GO                    Logan Conner 06/03/2018, 11:36 AM

## 2018-06-03 NOTE — Progress Notes (Signed)
Discussed patient with Dr. Leonie Man and orders received to  [facility] ASA, increase IV NS rate and for PT to see patient.    Discussed patient with Dr. Estanislado Pandy and order received to discontinue Arteria line so Cleviprex weaning can continue.

## 2018-06-03 NOTE — Evaluation (Signed)
Physical Therapy Evaluation Patient Details Name: Logan Conner MRN: [id number] DOB: [birth year] Today's Date: 06/03/2018   History of Present Illness  Logan Coberly Stockardis a 84 y.o.malewith past medical history of hyperlipidemia, hypertension, CKD stage III. Presented to ED code stroke. tPA was administered. CT was positive for hyperdense Left MCA M1 compatible with emergent large vessel occlusion and no hemorrhage or changes of acute infarct are identified. S/p Lt common carotid arteriogram followed by complete revascularization of Lt MCA M 1 occlusion MRI revealed Acute left MCA and distal left ACA infarcts.  Clinical Impression  Pt admitted with/for L MCA M1 occlusion and a L ACA infarct s/p complete revascularization.  Pt presently globally aphasic, inattentive to the Right with little noticeable movement on the right except during automatic task of standing..  Pt currently limited functionally due to the problems listed. ( See problems list.)   Pt will benefit from PT to maximize function and safety in order to get ready for next venue listed below.     Follow Up Recommendations SNF    Equipment Recommendations  None recommended by PT    Recommendations for Other Services       Precautions / Restrictions Precautions Precautions: Fall Restrictions Weight Bearing Restrictions: No      Mobility  Bed Mobility Overal bed mobility: Needs Assistance Bed Mobility: Supine to Sit;Sit to Supine     Supine to sit: Total assist;+2 for physical assistance;+2 for safety/equipment Sit to supine: Total assist;+2 for physical assistance;+2 for safety/equipment   General bed mobility comments: total +2 for all aspects of bed mobility  Transfers Overall transfer level: Needs assistance Equipment used: 2 person hand held assist Transfers: Sit to/from Stand Sit to Stand: Max assist;+2 physical assistance;+2 safety/equipment;Min assist         General transfer comment: to power  up into standing, max A +2; once standing min A +2 to maintain standing  Ambulation/Gait             General Gait Details: not able  Stairs            Wheelchair Mobility    Modified Rankin (Stroke Patients Only)       Balance Overall balance assessment: Needs assistance Sitting-balance support: Single extremity supported;Feet supported Sitting balance-Leahy Scale: Poor Sitting balance - Comments: at least mod A to maintain seated balance right lateral lean Postural control: Right lateral lean;Left lateral lean Standing balance support: Bilateral upper extremity supported Standing balance-Leahy Scale: Poor Standing balance comment: dependent on external support, left lateral lean in standing                             Pertinent Vitals/Pain      Home Living Family/patient expects to be discharged to:: Skilled nursing facility Living Arrangements: Spouse/significant other(Pt is caregiver for spouse) Available Help at Discharge: Family Type of Home: House           Additional Comments: Gleaned from chart review.     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, driving, ambulating without DME     Hand Dominance   Dominant Hand: Right    Extremity/Trunk Assessment   Upper Extremity Assessment Upper Extremity Assessment: RUE deficits/detail;Generalized weakness RUE Deficits / Details: not following commands hard to determine strength vs sensation, but will not let hs arm drop RUE Coordination: decreased fine motor;decreased gross motor  Lower Extremity Assessment Lower Extremity Assessment: Difficult to assess due to impaired cognition;RLE deficits/detail;LLE deficits/detail RLE Deficits / Details: no voluntary movement/spontaneous movement until stood up and then R LE bore weight. LLE Deficits / Details: Bore weight in standing,  assisted ROM against gravity    Cervical /  Trunk Assessment Cervical / Trunk Assessment: Kyphotic  Communication   Communication: Expressive difficulties  Cognition Arousal/Alertness: Awake/alert Behavior During Therapy: Flat affect Overall Cognitive Status: Difficult to assess                                 General Comments: global aphasia? Pt did not communicate throughout session. Pt seemed to follow minimal commands with 15 second delay.      General Comments General comments (skin integrity, edema, etc.): no family present during session, VSS throughout    Exercises     Assessment/Plan    PT Assessment Patient needs continued PT services  PT Problem List Decreased strength;Decreased activity tolerance;Decreased balance;Decreased mobility;Decreased coordination       PT Treatment Interventions Functional mobility training;Therapeutic activities;Therapeutic exercise;Balance training;Neuromuscular re-education;Patient/family education;Gait training;DME instruction    PT Goals (Current goals can be found in the Care Plan section)  Acute Rehab PT Goals Patient Stated Goal: none stated PT Goal Formulation: Patient unable to participate in goal setting Time For Goal Achievement: 06/17/18 Potential to Achieve Goals: Fair    Frequency Min 3X/week   Barriers to discharge        Co-evaluation PT/OT/SLP Co-Evaluation/Treatment: Yes Reason for Co-Treatment: Complexity of the patient's impairments (multi-system involvement) PT goals addressed during session: Mobility/safety with mobility OT goals addressed during session: ADL's and self-care;Strengthening/ROM       AM-PAC PT "6 Clicks" Daily Activity  Outcome Measure Difficulty turning over in bed (including adjusting bedclothes, sheets and blankets)?: Unable Difficulty moving from lying on back to sitting on the side of the bed? : Unable Difficulty sitting down on and standing up from a chair with arms (e.g., wheelchair, bedside commode, etc,.)?:  Unable Help needed moving to and from a bed to chair (including a wheelchair)?: Total Help needed walking in hospital room?: Total Help needed climbing 3-5 steps with a railing? : Total 6 Click Score: 6    End of Session   Activity Tolerance: Patient tolerated treatment well Patient left: in bed;with call bell/phone within reach;with nursing/sitter in room Nurse Communication: Mobility status PT Visit Diagnosis: Hemiplegia and hemiparesis Hemiplegia - Right/Left: Right Hemiplegia - caused by: Cerebral infarction    Time: 6226-3335 PT Time Calculation (min) (ACUTE ONLY): 30 min   Charges:   PT Evaluation $PT Eval Moderate Complexity: 1 Mod     PT G Codes:        June 08, 2018  Donnella Sham, PT [phone]  (pager)  Tessie Fass Undra Trembath 06/08/2018, 4:37 PM

## 2018-06-03 NOTE — Progress Notes (Signed)
  Echocardiogram 2D Echocardiogram has been performed.  Logan Conner 06/03/2018, 9:00 AM

## 2018-06-04 MED ORDER — LORAZEPAM 2 MG/ML IJ SOLN: 1.0000 mg | INTRAMUSCULAR | Status: DC | PRN

## 2018-06-04 MED ORDER — HALOPERIDOL LACTATE 2 MG/ML PO CONC
0.5000 mg | ORAL | Status: DC | PRN
  Filled 2018-06-04: qty 0.3

## 2018-06-04 MED ORDER — HALOPERIDOL 0.5 MG PO TABS
0.5000 mg | ORAL_TABLET | ORAL | Status: DC | PRN
  Filled 2018-06-04: qty 1

## 2018-06-04 MED ORDER — MORPHINE SULFATE (CONCENTRATE) 10 MG/0.5ML PO SOLN: 5.0000 mg | ORAL | Status: DC | PRN

## 2018-06-04 MED ORDER — LORAZEPAM 2 MG/ML PO CONC: 1.0000 mg | ORAL | Status: DC | PRN

## 2018-06-04 MED ORDER — HALOPERIDOL LACTATE 5 MG/ML IJ SOLN: 0.5000 mg | INTRAMUSCULAR | Status: DC | PRN

## 2018-06-04 MED ORDER — LORAZEPAM 1 MG PO TABS: 1.0000 mg | ORAL_TABLET | ORAL | Status: DC | PRN

## 2018-06-04 NOTE — Progress Notes (Deleted)
This RN received call from dialysis  Informing me that patient blood sugar is in the forties and not responding to hypoglycemic protocol and care order instructions for this patient. She wants to transport this patient back to this floor regardless of CBG and that she will accompany patient. I am awaiting his return.

## 2018-06-04 NOTE — Progress Notes (Signed)
Family has decided for full comfort care. They request hospice facility placement in [place]. Discussed w/ RN and Dr. Leonie Man.  Pt made DNR, agressive care measures stopped and prns for comfort ordered. Have asked SW to assess for residential hospice placement. Will transfer to palliative care bed.  Burnetta Sabin, MSN, APRN, ANVP-BC, AGPCNP-BC Advanced Practice Stroke Nurse [place] for Schedule & Pager information 06/04/2018 2:32 PM

## 2018-06-04 NOTE — Progress Notes (Signed)
Referring Physician(s): Dr Royal Hawthorn  Supervising Physician: Luanne Bras  Patient Status:  [facility] - In-pt  Chief Complaint:  Left MCA M1 segment occlusion s/p revascularization 06/02/2018 with Dr. Estanislado Pandy.  Subjective:  Pt is awake and alert Not conversive Seems to follow me but does not follow any commands  Allergies: Patient has no known allergies.  Medications: Prior to Admission medications   Medication Sig Start Date End Date Taking? Authorizing Provider  acetaminophen (TYLENOL) 650 MG CR tablet Take 1,300 mg by mouth every 8 (eight) hours as needed for pain.   Yes [provider]  bimatoprost (LUMIGAN) 0.01 % SOLN Place 1 drop into both eyes at bedtime.  04/21/14  Yes [provider]  brimonidine (ALPHAGAN P) 0.1 % SOLN Apply 1 drop to eye 3 (three) times daily.    Yes [provider]  budesonide-formoterol (SYMBICORT) 160-4.5 MCG/ACT inhaler Inhale 2 puffs into the lungs 2 (two) times daily.    Yes [provider]  finasteride (PROSCAR) 5 MG tablet Take 1 tablet (5 mg total) by mouth daily. 03/16/18 03/16/19 Yes Festus Aloe, MD  hydroxychloroquine (PLAQUENIL) 200 MG tablet TAKE 1 TABLET BY MOUTH ONCE DAILY. 02/26/16  Yes [provider]  levothyroxine (SYNTHROID) 50 MCG tablet TAKE 1 TABLET BY MOUTH EVERY MORNING ON AN EMPTY STOMACH. AT LEAST 30-60 [place], WITH A GLASS OF WATER 08/12/16  Yes [provider]  metoprolol succinate (TOPROL-XL) 50 MG 24 hr tablet TAKE ONE TABLET BY MOUTH ONCE DAILY 04/30/16  Yes [provider]  montelukast (SINGULAIR) 10 MG tablet Take 10 mg by mouth at bedtime.  05/16/16  Yes [provider]  Multiple Vitamins-Minerals (CEROVITE SENIOR) TABS Take 1 tablet by mouth daily.   Yes [provider]  olmesartan (BENICAR) 40 MG tablet TAKE 1 TABLET BY MOUTH ONCE DAILY. 01/29/16  Yes [provider]  simvastatin (ZOCOR) 40 MG tablet Take 40 mg  by mouth daily at 6 PM.   Yes [provider]  tamsulosin (FLOMAX) 0.4 MG CAPS capsule Take 1 capsule (0.4 mg total) by mouth daily after supper. 08/20/17  Yes Festus Aloe, MD     Vital Signs: BP 138/86   Pulse 92   Temp 98.7 F (37.1 C) (Axillary)   Resp (!) 25   Ht 6' (1.829 m)   Wt 151 lb 10.8 oz (68.8 kg)   SpO2 97%   BMI 20.57 kg/m   Physical Exam  Constitutional:  Does not follow any commands Appears awake/alert Does not speak to me Does follow me in room  Neurological: He is alert.  Skin: Skin is warm and dry.  Right groin site is clean and dry No hematoma Right foot 1+ pulses/ doppler+   Vitals reviewed.   Imaging: Ct Angio Head W Or Wo Contrast  Result Date: 06/02/2018 CLINICAL DATA:  84 year old male with right side weakness, aphasia. Evidence of left MCA LVO on noncontrast head CT. EXAM: CT ANGIOGRAPHY HEAD AND NECK TECHNIQUE: Multidetector CT imaging of the head and neck was performed using the standard protocol during bolus administration of intravenous contrast. Multiplanar CT image reconstructions and MIPs were obtained to evaluate the vascular anatomy. Carotid stenosis measurements (when applicable) are obtained utilizing NASCET criteria, using the distal internal carotid diameter as the denominator. CONTRAST:  41mL ISOVUE-370 IOPAMIDOL (ISOVUE-370) INJECTION 76% COMPARISON:  Head CT without contrast 1358 hours today. FINDINGS: CT HEAD Reported separately. CTA NECK Skeleton: Motion artifact about the mandible. Widespread severe cervical spine degeneration.  Osteopenia. No acute osseous abnormality identified. Upper chest: Negative lung apices. No superior mediastinal lymphadenopathy. Other neck: Negative. No neck mass or lymphadenopathy. Aortic arch: 3 vessel arch configuration. Moderate Calcified aortic atherosclerosis. Right carotid system: No brachiocephalic or right CCA origin stenosis. Widely patent right carotid bifurcation and cervical right  ICA. Left carotid system: No left CCA origin stenosis despite mild soft and calcified plaque. Mildly tortuous left CCA. Calcified plaque at the left ICA origin but widely patent proximal left ICA. No cervical left ICA stenosis or significant tortuosity. Vertebral arteries: No proximal right subclavian artery stenosis despite calcified plaque. Normal right vertebral artery origin. The right vertebral artery appears mildly dominant and normal to the skull base. No proximal left subclavian artery stenosis despite calcified plaque. Normal left vertebral artery origin. The left vertebral is mildly non dominant and patent to the skull base without stenosis. CTA HEAD Posterior circulation: Mild right Vertebral artery V4 calcified plaque without stenosis. No distal left vertebral artery stenosis. Tortuous distal vertebral arteries. Normal PICA origins. Patent vertebrobasilar junction and basilar artery without stenosis. Normal SCA and PCA origins. Posterior communicating arteries are diminutive or absent. Left PCA branches are within normal limits. There is a moderate to severe stenosis of the right PCA P2 segment, but the remaining right PCA branches are within normal limits (series 12, image 19). Anterior circulation: Both ICA siphons are patent with predominantly mild calcified plaque. No left patent left ICA terminus. There is mild to moderate right ICA supraclinoid segment stenosis due to calcified plaque. Patent right ICA terminus. The left MCA M1 segment is occluded about 10 millimeters beyond its origin just distal to the origin of a small left anterior temporal artery. There is very little early arterial phase collateral flow in the left MCA branches. The left ACA A1 segment is dominant and normal. Anterior communicating artery is mildly ectatic without aneurysm. Proximal ACA A2 branches are tortuous. Other ACA branches are within normal limits. Right MCA M1 segment and right MCA bifurcation are patent. Right MCA  branches are within normal limits. Siphon stenosis. Venous sinuses: Not well evaluated due to early contrast timing. Anatomic variants: Mildly dominant right vertebral artery. Dominant left ACA A1 segment. Review of the MIP images confirms the above findings IMPRESSION: 1. Positive for Left MCA mid M1 emergent large vessel occlusion. No significant collateral flow on these early arterial phase images. This result was communicated to Dr. [name] at at [place] hours by text page via the [facility] messaging system. 2. No left carotid stenosis and no other arterial stenosis in the neck. 3. There is a moderate to severe stenosis of the right PCA P2 segment. Otherwise negative posterior circulation. 4.  Aortic Atherosclerosis (ICD10-I70.0). Electronically Signed   By: Genevie Ann M.D.   On: 06/02/2018 14:21   Ct Angio Neck W Or Wo Contrast  Result Date: 06/02/2018 CLINICAL DATA:  84 year old male with right side weakness, aphasia. Evidence of left MCA LVO on noncontrast head CT. EXAM: CT ANGIOGRAPHY HEAD AND NECK TECHNIQUE: Multidetector CT imaging of the head and neck was performed using the standard protocol during bolus administration of intravenous contrast. Multiplanar CT image reconstructions and MIPs were obtained to evaluate the vascular anatomy. Carotid stenosis measurements (when applicable) are obtained utilizing NASCET criteria, using the distal internal carotid diameter as the denominator. CONTRAST:  61mL ISOVUE-370 IOPAMIDOL (ISOVUE-370) INJECTION 76% COMPARISON:  Head CT without contrast 1358 hours today. FINDINGS: CT HEAD Reported separately. CTA NECK Skeleton: Motion artifact about the mandible. Widespread severe  cervical spine degeneration. Osteopenia. No acute osseous abnormality identified. Upper chest: Negative lung apices. No superior mediastinal lymphadenopathy. Other neck: Negative. No neck mass or lymphadenopathy. Aortic arch: 3 vessel arch configuration. Moderate Calcified aortic atherosclerosis. Right  carotid system: No brachiocephalic or right CCA origin stenosis. Widely patent right carotid bifurcation and cervical right ICA. Left carotid system: No left CCA origin stenosis despite mild soft and calcified plaque. Mildly tortuous left CCA. Calcified plaque at the left ICA origin but widely patent proximal left ICA. No cervical left ICA stenosis or significant tortuosity. Vertebral arteries: No proximal right subclavian artery stenosis despite calcified plaque. Normal right vertebral artery origin. The right vertebral artery appears mildly dominant and normal to the skull base. No proximal left subclavian artery stenosis despite calcified plaque. Normal left vertebral artery origin. The left vertebral is mildly non dominant and patent to the skull base without stenosis. CTA HEAD Posterior circulation: Mild right Vertebral artery V4 calcified plaque without stenosis. No distal left vertebral artery stenosis. Tortuous distal vertebral arteries. Normal PICA origins. Patent vertebrobasilar junction and basilar artery without stenosis. Normal SCA and PCA origins. Posterior communicating arteries are diminutive or absent. Left PCA branches are within normal limits. There is a moderate to severe stenosis of the right PCA P2 segment, but the remaining right PCA branches are within normal limits (series 12, image 19). Anterior circulation: Both ICA siphons are patent with predominantly mild calcified plaque. No left patent left ICA terminus. There is mild to moderate right ICA supraclinoid segment stenosis due to calcified plaque. Patent right ICA terminus. The left MCA M1 segment is occluded about 10 millimeters beyond its origin just distal to the origin of a small left anterior temporal artery. There is very little early arterial phase collateral flow in the left MCA branches. The left ACA A1 segment is dominant and normal. Anterior communicating artery is mildly ectatic without aneurysm. Proximal ACA A2 branches are  tortuous. Other ACA branches are within normal limits. Right MCA M1 segment and right MCA bifurcation are patent. Right MCA branches are within normal limits. Siphon stenosis. Venous sinuses: Not well evaluated due to early contrast timing. Anatomic variants: Mildly dominant right vertebral artery. Dominant left ACA A1 segment. Review of the MIP images confirms the above findings IMPRESSION: 1. Positive for Left MCA mid M1 emergent large vessel occlusion. No significant collateral flow on these early arterial phase images. This result was communicated to Dr. [name] at at [place] hours by text page via the [facility] messaging system. 2. No left carotid stenosis and no other arterial stenosis in the neck. 3. There is a moderate to severe stenosis of the right PCA P2 segment. Otherwise negative posterior circulation. 4.  Aortic Atherosclerosis (ICD10-I70.0). Electronically Signed   By: Genevie Ann M.D.   On: 06/02/2018 14:21   Mr Brain Wo Contrast  Result Date: 06/03/2018 CLINICAL DATA:  Follow-up stroke. Left M1 occlusion status post revascularization yesterday. EXAM: MRI HEAD WITHOUT CONTRAST TECHNIQUE: Multiplanar, multiecho pulse sequences of the brain and surrounding structures were obtained without intravenous contrast. COMPARISON:  Head CT and CTA 06/02/2018 FINDINGS: Brain: There is an acute moderate-sized left MCA infarct with involvement of the temporal lobe, parietal lobe, insula, lentiform nucleus, and caudate body. There is also an acute distal left ACA infarct involving the cingulate gyrus and posteromedial frontal lobe. No intracranial hemorrhage, mass, midline shift, or extra-axial fluid collection is identified. Vascular: Major intracranial vascular flow voids are preserved. Skull and upper cervical spine: Unremarkable bone marrow signal.  Prominent C1-2 arthropathy. Sinuses/Orbits: Right cataract extraction. Left sphenoid sinus mucosal thickening and small volume fluid. Clear mastoid air cells. Other: None.  IMPRESSION: 1. Acute left MCA and distal left ACA infarcts. 2. Mild chronic small vessel ischemic disease. Electronically Signed   By: Logan Bores M.D.   On: 06/03/2018 14:22   Ct Head Code Stroke Wo Contrast  Result Date: 06/02/2018 CLINICAL DATA:  Code stroke. 84 year old male with right side weakness, aphasia. EXAM: CT HEAD WITHOUT CONTRAST TECHNIQUE: Contiguous axial images were obtained from the base of the skull through the vertex without intravenous contrast. COMPARISON:  No prior brain imaging. FINDINGS: Brain: No changes of acute cortically based infarct identified. Gray-white matter differentiation appears maintained in the left hemisphere. No acute intracranial hemorrhage identified. No midline shift, mass effect, or evidence of intracranial mass lesion. No ventriculomegaly. No cortical encephalomalacia identified, normal white matter and deep gray matter nuclei appearance for age. Vascular: Calcified atherosclerosis at the skull base. Conspicuous abnormal left MCA M1 hyperdensity (series 5, image 32). Skull: No acute osseous abnormality identified. Sinuses/Orbits: Minor bubbly opacity in the left sphenoid. Otherwise the visible paranasal sinuses are well pneumatized. The tympanic cavities and mastoids are clear. Other: There is some leftward gaze deviation. No other acute orbit or scalp soft tissue finding. ASPECTS [facility] Stroke Program Early CT Score) - Ganglionic level infarction (caudate, lentiform nuclei, internal capsule, insula, M1-M3 cortex): 7 - Supraganglionic infarction (M4-M6 cortex): 3 Total score (0-10 with 10 being normal): 10 IMPRESSION: 1. Positive for hyperdense Left MCA M1 compatible with Emergent Large Vessel Occlusion. 2. No hemorrhage or changes of acute infarct are identified. ASPECTS 10. 3. These results were communicated to Dr. [name] at 2:01 pmon 7/23/2019by text page via the [facility] messaging system. Electronically Signed   By: Genevie Ann M.D.   On: 06/02/2018 14:02     Labs:  CBC: Recent Labs    06/10/17 0141 06/02/18 1347 06/02/18 1353 06/03/18 0344  WBC 12.1* 8.2  --  12.0*  HGB 14.6 13.1 12.2* 11.9*  HCT 42.9 41.7 36.0* 36.5*  PLT 204 155  --  154    COAGS: Recent Labs    06/02/18 1347  INR 1.08  APTT 34    BMP: Recent Labs    06/10/17 0141  03/11/18 1411 06/02/18 1347 06/02/18 1353 06/03/18 0344  NA 141  --   --  142 144 143  K 4.6  --   --  3.8 3.7 3.8  CL 108  --   --  110 109 114*  CO2 23  --   --  24  --  22  GLUCOSE 150*  --   --  135* 130* 129*  BUN 27*  --   --  24* 27* 21  CALCIUM 9.5  --   --  9.2  --  8.7*  CREATININE 0.99   < > 1.00 1.11 1.20 0.94  GFRNONAA >60  --   --  56*  --  >60  GFRAA >60  --   --  >60  --  >60   < > = values in this interval not displayed.    LIVER FUNCTION TESTS: Recent Labs    06/02/18 1347  BILITOT 0.6  AST 16  ALT 13  ALKPHOS 63  PROT 5.8*  ALBUMIN 3.7    Assessment and Plan:  CVA L MCA revascularization 06/02/18 Plan per Stroke team  Electronically Signed: Hesham Womac A, PA-C 06/04/2018, 11:21 AM   I spent a total of  15 Minutes at the the patient's bedside AND on the patient's hospital floor or unit, greater than 50% of which was counseling/coordinating care for CVA; L MCA revasc

## 2018-06-04 NOTE — Progress Notes (Signed)
Nutrition Brief Note  Chart reviewed. Pt discussed during ICU rounds and with RN.  Pt now transitioning to comfort care.  No nutrition interventions planned at this time.  Please consult as needed.   [place], [place], [place] Pager [phone] After Hours Pager

## 2018-06-04 NOTE — Social Work (Addendum)
CSW acknowledging consult for pt referral to [facility], have faxed referral to [facility] with intake at the home. Fax # [phone]. Await response regarding bed availability.  4:53pm- Pt is being reviewed at [place], RN liaison called CSW back. Additional information regarding PCP of pt provided. They will follow up in the morning with bed availability.   Continuing to follow.  [place], [place] Work [phone]

## 2018-06-04 NOTE — Progress Notes (Addendum)
STROKE TEAM PROGRESS NOTE  INTERVAL HISTORY Daughters at bedside. Pt stable from yesterday. Awake but not aware nor interactive. Dr. Leonie Man discussed imaging results w. Family and discussed probable long-term condition expected. Discussed feeding tubes - they know they do not want long-term PEG but ok with short-term tube until we see if swallow returns. Discussed SNF, hospice, etc. Daughters to discuss with MD family member about their decision.   Vitals:   06/04/18 0600 06/04/18 0700 06/04/18 0800 06/04/18 0900  BP: 136/80 (!) 151/92 128/74 138/86  Pulse: 83 (!) 106 94 92  Resp: (!) 22 (!) 30 (!) 29 (!) 25  Temp:   98.7 F (37.1 C)   TempSrc:   Axillary   SpO2: 97% 94% 94% 97%  Weight:      Height:        CBC:  Recent Labs  Lab 06/02/18 1347 06/02/18 1353 06/03/18 0344  WBC 8.2  --  12.0*  NEUTROABS 5.3  --  9.9*  HGB 13.1 12.2* 11.9*  HCT 41.7 36.0* 36.5*  MCV 102.2*  --  97.6  PLT 155  --  956    Basic Metabolic Panel:  Recent Labs  Lab 06/02/18 1347 06/02/18 1353 06/03/18 0344  NA 142 144 143  K 3.8 3.7 3.8  CL 110 109 114*  CO2 24  --  22  GLUCOSE 135* 130* 129*  BUN 24* 27* 21  CREATININE 1.11 1.20 0.94  CALCIUM 9.2  --  8.7*   Lipid Panel:     Component Value Date/Time   CHOL 124 06/03/2018 0344   TRIG 76 06/03/2018 0344   HDL 49 06/03/2018 0344   CHOLHDL 2.5 06/03/2018 0344   VLDL 15 06/03/2018 0344   LDLCALC 60 06/03/2018 0344   HgbA1c:  Lab Results  Component Value Date   HGBA1C 6.0 (H) 06/03/2018   IMAGING Ct Angio Head W Or Wo Contrast  Result Date: 06/02/2018 CLINICAL DATA:  84 year old male with right side weakness, aphasia. Evidence of left MCA LVO on noncontrast head CT. EXAM: CT ANGIOGRAPHY HEAD AND NECK TECHNIQUE: Multidetector CT imaging of the head and neck was performed using the standard protocol during bolus administration of intravenous contrast. Multiplanar CT image reconstructions and MIPs were obtained to evaluate the vascular  anatomy. Carotid stenosis measurements (when applicable) are obtained utilizing NASCET criteria, using the distal internal carotid diameter as the denominator. CONTRAST:  5mL ISOVUE-370 IOPAMIDOL (ISOVUE-370) INJECTION 76% COMPARISON:  Head CT without contrast 1358 hours today. FINDINGS: CT HEAD Reported separately. CTA NECK Skeleton: Motion artifact about the mandible. Widespread severe cervical spine degeneration. Osteopenia. No acute osseous abnormality identified. Upper chest: Negative lung apices. No superior mediastinal lymphadenopathy. Other neck: Negative. No neck mass or lymphadenopathy. Aortic arch: 3 vessel arch configuration. Moderate Calcified aortic atherosclerosis. Right carotid system: No brachiocephalic or right CCA origin stenosis. Widely patent right carotid bifurcation and cervical right ICA. Left carotid system: No left CCA origin stenosis despite mild soft and calcified plaque. Mildly tortuous left CCA. Calcified plaque at the left ICA origin but widely patent proximal left ICA. No cervical left ICA stenosis or significant tortuosity. Vertebral arteries: No proximal right subclavian artery stenosis despite calcified plaque. Normal right vertebral artery origin. The right vertebral artery appears mildly dominant and normal to the skull base. No proximal left subclavian artery stenosis despite calcified plaque. Normal left vertebral artery origin. The left vertebral is mildly non dominant and patent to the skull base without stenosis. CTA HEAD Posterior circulation: Mild right  Vertebral artery V4 calcified plaque without stenosis. No distal left vertebral artery stenosis. Tortuous distal vertebral arteries. Normal PICA origins. Patent vertebrobasilar junction and basilar artery without stenosis. Normal SCA and PCA origins. Posterior communicating arteries are diminutive or absent. Left PCA branches are within normal limits. There is a moderate to severe stenosis of the right PCA P2 segment, but  the remaining right PCA branches are within normal limits (series 12, image 19). Anterior circulation: Both ICA siphons are patent with predominantly mild calcified plaque. No left patent left ICA terminus. There is mild to moderate right ICA supraclinoid segment stenosis due to calcified plaque. Patent right ICA terminus. The left MCA M1 segment is occluded about 10 millimeters beyond its origin just distal to the origin of a small left anterior temporal artery. There is very little early arterial phase collateral flow in the left MCA branches. The left ACA A1 segment is dominant and normal. Anterior communicating artery is mildly ectatic without aneurysm. Proximal ACA A2 branches are tortuous. Other ACA branches are within normal limits. Right MCA M1 segment and right MCA bifurcation are patent. Right MCA branches are within normal limits. Siphon stenosis. Venous sinuses: Not well evaluated due to early contrast timing. Anatomic variants: Mildly dominant right vertebral artery. Dominant left ACA A1 segment. Review of the MIP images confirms the above findings IMPRESSION: 1. Positive for Left MCA mid M1 emergent large vessel occlusion. No significant collateral flow on these early arterial phase images. This result was communicated to Dr. [name] at at [place] hours by text page via the [facility] messaging system. 2. No left carotid stenosis and no other arterial stenosis in the neck. 3. There is a moderate to severe stenosis of the right PCA P2 segment. Otherwise negative posterior circulation. 4.  Aortic Atherosclerosis (ICD10-I70.0). Electronically Signed   By: Genevie Ann M.D.   On: 06/02/2018 14:21   Ct Angio Neck W Or Wo Contrast  Result Date: 06/02/2018 CLINICAL DATA:  84 year old male with right side weakness, aphasia. Evidence of left MCA LVO on noncontrast head CT. EXAM: CT ANGIOGRAPHY HEAD AND NECK TECHNIQUE: Multidetector CT imaging of the head and neck was performed using the standard protocol during bolus  administration of intravenous contrast. Multiplanar CT image reconstructions and MIPs were obtained to evaluate the vascular anatomy. Carotid stenosis measurements (when applicable) are obtained utilizing NASCET criteria, using the distal internal carotid diameter as the denominator. CONTRAST:  7mL ISOVUE-370 IOPAMIDOL (ISOVUE-370) INJECTION 76% COMPARISON:  Head CT without contrast 1358 hours today. FINDINGS: CT HEAD Reported separately. CTA NECK Skeleton: Motion artifact about the mandible. Widespread severe cervical spine degeneration. Osteopenia. No acute osseous abnormality identified. Upper chest: Negative lung apices. No superior mediastinal lymphadenopathy. Other neck: Negative. No neck mass or lymphadenopathy. Aortic arch: 3 vessel arch configuration. Moderate Calcified aortic atherosclerosis. Right carotid system: No brachiocephalic or right CCA origin stenosis. Widely patent right carotid bifurcation and cervical right ICA. Left carotid system: No left CCA origin stenosis despite mild soft and calcified plaque. Mildly tortuous left CCA. Calcified plaque at the left ICA origin but widely patent proximal left ICA. No cervical left ICA stenosis or significant tortuosity. Vertebral arteries: No proximal right subclavian artery stenosis despite calcified plaque. Normal right vertebral artery origin. The right vertebral artery appears mildly dominant and normal to the skull base. No proximal left subclavian artery stenosis despite calcified plaque. Normal left vertebral artery origin. The left vertebral is mildly non dominant and patent to the skull base without stenosis. CTA HEAD Posterior  circulation: Mild right Vertebral artery V4 calcified plaque without stenosis. No distal left vertebral artery stenosis. Tortuous distal vertebral arteries. Normal PICA origins. Patent vertebrobasilar junction and basilar artery without stenosis. Normal SCA and PCA origins. Posterior communicating arteries are diminutive or  absent. Left PCA branches are within normal limits. There is a moderate to severe stenosis of the right PCA P2 segment, but the remaining right PCA branches are within normal limits (series 12, image 19). Anterior circulation: Both ICA siphons are patent with predominantly mild calcified plaque. No left patent left ICA terminus. There is mild to moderate right ICA supraclinoid segment stenosis due to calcified plaque. Patent right ICA terminus. The left MCA M1 segment is occluded about 10 millimeters beyond its origin just distal to the origin of a small left anterior temporal artery. There is very little early arterial phase collateral flow in the left MCA branches. The left ACA A1 segment is dominant and normal. Anterior communicating artery is mildly ectatic without aneurysm. Proximal ACA A2 branches are tortuous. Other ACA branches are within normal limits. Right MCA M1 segment and right MCA bifurcation are patent. Right MCA branches are within normal limits. Siphon stenosis. Venous sinuses: Not well evaluated due to early contrast timing. Anatomic variants: Mildly dominant right vertebral artery. Dominant left ACA A1 segment. Review of the MIP images confirms the above findings IMPRESSION: 1. Positive for Left MCA mid M1 emergent large vessel occlusion. No significant collateral flow on these early arterial phase images. This result was communicated to Dr. [name] at at [place] hours by text page via the [facility] messaging system. 2. No left carotid stenosis and no other arterial stenosis in the neck. 3. There is a moderate to severe stenosis of the right PCA P2 segment. Otherwise negative posterior circulation. 4.  Aortic Atherosclerosis (ICD10-I70.0). Electronically Signed   By: Genevie Ann M.D.   On: 06/02/2018 14:21   Mr Brain Wo Contrast  Result Date: 06/03/2018 CLINICAL DATA:  Follow-up stroke. Left M1 occlusion status post revascularization yesterday. EXAM: MRI HEAD WITHOUT CONTRAST TECHNIQUE: Multiplanar,  multiecho pulse sequences of the brain and surrounding structures were obtained without intravenous contrast. COMPARISON:  Head CT and CTA 06/02/2018 FINDINGS: Brain: There is an acute moderate-sized left MCA infarct with involvement of the temporal lobe, parietal lobe, insula, lentiform nucleus, and caudate body. There is also an acute distal left ACA infarct involving the cingulate gyrus and posteromedial frontal lobe. No intracranial hemorrhage, mass, midline shift, or extra-axial fluid collection is identified. Vascular: Major intracranial vascular flow voids are preserved. Skull and upper cervical spine: Unremarkable bone marrow signal. Prominent C1-2 arthropathy. Sinuses/Orbits: Right cataract extraction. Left sphenoid sinus mucosal thickening and small volume fluid. Clear mastoid air cells. Other: None. IMPRESSION: 1. Acute left MCA and distal left ACA infarcts. 2. Mild chronic small vessel ischemic disease. Electronically Signed   By: Logan Bores M.D.   On: 06/03/2018 14:22   Ct Head Code Stroke Wo Contrast  Result Date: 06/02/2018 CLINICAL DATA:  Code stroke. 84 year old male with right side weakness, aphasia. EXAM: CT HEAD WITHOUT CONTRAST TECHNIQUE: Contiguous axial images were obtained from the base of the skull through the vertex without intravenous contrast. COMPARISON:  No prior brain imaging. FINDINGS: Brain: No changes of acute cortically based infarct identified. Gray-white matter differentiation appears maintained in the left hemisphere. No acute intracranial hemorrhage identified. No midline shift, mass effect, or evidence of intracranial mass lesion. No ventriculomegaly. No cortical encephalomalacia identified, normal white matter and deep gray matter nuclei  appearance for age. Vascular: Calcified atherosclerosis at the skull base. Conspicuous abnormal left MCA M1 hyperdensity (series 5, image 32). Skull: No acute osseous abnormality identified. Sinuses/Orbits: Minor bubbly opacity in the  left sphenoid. Otherwise the visible paranasal sinuses are well pneumatized. The tympanic cavities and mastoids are clear. Other: There is some leftward gaze deviation. No other acute orbit or scalp soft tissue finding. ASPECTS [facility] Stroke Program Early CT Score) - Ganglionic level infarction (caudate, lentiform nuclei, internal capsule, insula, M1-M3 cortex): 7 - Supraganglionic infarction (M4-M6 cortex): 3 Total score (0-10 with 10 being normal): 10 IMPRESSION: 1. Positive for hyperdense Left MCA M1 compatible with Emergent Large Vessel Occlusion. 2. No hemorrhage or changes of acute infarct are identified. ASPECTS 10. 3. These results were communicated to Dr. [name] at 2:01 pmon 7/23/2019by text page via the [facility] messaging system. Electronically Signed   By: Genevie Ann M.D.   On: 06/02/2018 14:02   Cerebral angio 06/02/2018 S/p Lt common carotid arteriogram followed by complete revascularization of Lt MCA M 1 occlusion with x 1 pass with embotrap  41mm x 33 mm retriever device achieving a TICI 3 revascularization  2D Echocardiogram  - Left ventricle: The cavity size was normal. Wall thickness was   increased in a pattern of mild LVH. Systolic function was normal.   The estimated ejection fraction was in the range of 60% to 65%.   The study is not technically sufficient to allow evaluation of LV   diastolic function. - Left atrium: The atrium was moderately dilated. - Atrial septum: No defect or patent foramen ovale was identified. - Pulmonary arteries: PA peak pressure: 47 mm Hg (S). - Pericardium, extracardiac: Small to moderate focal posterior   lateral pericardial effusion with no evidence of tamponade.  PHYSICAL EXAM Frail elderly Caucasian male not in distress. . Afebrile. Head is nontraumatic. Neck is supple without bruit.    Cardiac exam no murmur or gallop. Lungs are clear to auscultation. Distal pulses are well felt.has right groin arterial sheath  Neurological Exam :  Awake alert  globally aphasic and mute. Has left gaze and head deviation. Will not follow gaze to the right. Blinks to threat on the left but not the right. Pupils irregular 3 mm reactive. Corneal reflexes present. Fundi not visualized. Right lower facial weakness. Tongue midline. Flaccid right hemiplegia but will withdraw right lower extremity well and right upper extremity trace to painful stimuli. Purposeful antigravity movements on the left side. Tone is diminished on the right and normal on the left. Reflexes diminished on the right and normal on the left. Right plantar upgoing left downgoing.   ASSESSMENT/PLAN Logan Conner is a 84 y.o. male with history of HTN, HLD and CKD stage III presenting with L gaze, mute and unresponsive. Given tPA 06/02/2018 at 1403. Found to have a L M1 occlusion and taken to IR. Post IR TICI3 revascularization.   Stroke:  left MCA and ACA infarcts s/p IV tPA and IR w/ TICI3 reperfusion of L M1 occlusion, infarct embolic secondary to unknown source  R groin sheath remains in - to be removed  Code Stroke CT head Hyperdense L MCA M1 w/ ELVO, no hmg or acute stroke seen. ASPECTS 10.     CTA head & neck L MCA mid M1 ELVO. Mod stenosis R PCA P2. Aortic atherosclerosis.  Cerebral angio TICI3 reperfusion of L M1 occlusion with 1 pass embotrap  MRI  L MCA and L ACA territory infarcts. Small vessel disease.  2D Echo  EF 60-65%. No source of embolus   LDL 60  HgbA1c 6.0  SCDs for VTE prophylaxis Diet Order           Diet NPO time specified  Diet effective now        failed SLP assessment for swallow yesterday.   No antithrombotic prior to admission, now on No antithrombotic as within 24h of tPA  Therapy recommendations:  SNF  Disposition:  pending - independent PTA, still driving. anticipate SNF placement. Lived w/ wife PTA who has cognitive deficits and unable to care for self - she had [facility] 3d/week. dtrs aware of global plan.  Transfer to the floor    Keep on tele as embolic source of stroke not yet identified  Dysphagia  Secondary to stroke  NPO  Family deciding about short-term tube placement for feedings and meds  Hypertension  BP 120s, off Cleviprex drip  Increase SBP goal to < 180 . Long-term BP goal normotensive . Home meds:  Metoprolol XL 50, benicar 40  Hyperlipidemia  Home meds:  zocor 40 mg daily  LDL 60, goal < 70  Resume statin once able to swallow   Continue statin at discharge  Other Stroke Risk Factors  Advanced age  UDS / ETOH level not performed   Other Active Problems  CKD stage III  Hospital day # 2  Logan Sabin, MSN, APRN, ANVP-BC, AGPCNP-BC Advanced Practice Stroke Nurse [place] for Schedule & Pager information 06/04/2018 10:05 AM  I have personally examined this patient, reviewed notes, independently viewed imaging studies, participated in medical decision making and plan of care.ROS completed by me personally and pertinent positives fully documented  I have made any additions or clarifications directly to the above note. Agree with note above.  The patient underwent treatment with IV TPA and emergent thrombectomy but unfortunately has disabling left ACA and MCA infarcts. Will likely not be able to swallow for a while and family does not want a feeding tube on prolonged rehabilitation and nursing home stay which may be unavoidable. Family is likely leaning towards comfort care and will discuss with M.D. Family members and decide soon.This patient is critically ill and at significant risk of neurological worsening, death and care requires constant monitoring of vital signs, hemodynamics,respiratory and cardiac monitoring, extensive review of multiple databases, frequent neurological assessment, discussion with family, other specialists and medical decision making of high complexity.I have made any additions or clarifications directly to the above note.This critical  care time does not reflect procedure time, or teaching time or supervisory time of PA/NP/Med Resident etc but could involve care discussion time.  I spent 30 minutes of neurocritical care time  in the care of  this patient.      Antony Contras, MD Medical Director [facility] Stroke Center Pager: [phone] 06/04/2018 3:11 PM   To contact Stroke Continuity provider, please refer to [email or web address] After hours, contact General Neurology

## 2018-06-05 DIAGNOSIS — I6602 Occlusion and stenosis of left middle cerebral artery: Secondary | ICD-10-CM

## 2018-06-05 MED ORDER — HALOPERIDOL LACTATE 2 MG/ML PO CONC: 0.6000 mg | ORAL | 0 refills | Status: AC | PRN

## 2018-06-05 MED ORDER — LORAZEPAM 2 MG/ML PO CONC: 1.0000 mg | ORAL | 0 refills | Status: AC | PRN

## 2018-06-05 MED ORDER — ACETAMINOPHEN 650 MG RE SUPP: 650.0000 mg | RECTAL | 0 refills | Status: AC | PRN

## 2018-06-05 MED ORDER — COLLAGENASE 250 UNIT/GM EX OINT: TOPICAL_OINTMENT | Freq: Every day | CUTANEOUS | 0 refills | Status: AC

## 2018-06-05 MED ORDER — CHLORHEXIDINE GLUCONATE 0.12 % MT SOLN: 15.0000 mL | Freq: Two times a day (BID) | OROMUCOSAL | 0 refills | Status: AC

## 2018-06-05 MED ORDER — HALOPERIDOL 0.5 MG PO TABS: 0.5000 mg | ORAL_TABLET | ORAL | 0 refills | Status: AC | PRN

## 2018-06-05 MED ORDER — LORAZEPAM 1 MG PO TABS: 1.0000 mg | ORAL_TABLET | ORAL | 0 refills | Status: AC | PRN

## 2018-06-05 MED ORDER — ACETAMINOPHEN 325 MG PO TABS: 650.0000 mg | ORAL_TABLET | ORAL | 0 refills | Status: AC | PRN

## 2018-06-05 MED ORDER — MORPHINE SULFATE (CONCENTRATE) 10 MG/0.5ML PO SOLN: 5.0000 mg | ORAL | 0 refills | Status: AC | PRN

## 2018-06-05 NOTE — Care Management Important Message (Signed)
Important Message  Patient Details  Name: Logan Conner MRN: [id number] Date of Birth: 29-Sep-1925   Medicare Important Message Given:  Yes    Orbie Pyo 06/05/2018, 2:55 PM

## 2018-06-05 NOTE — Progress Notes (Signed)
SLP Cancellation Note  Patient Details Name: Logan Conner MRN: [id number] DOB: 1925-10-19   Cancelled treatment:       Reason Eval/Treat Not Completed: SLP screened, no needs identified, will sign off. New swallow orders received; per chart review pt is full comfort measures with hospice d/c pending. SLP attempted visit in case of family questions; no one present. D/w RN, will sign off, RN may page SLP if family with questions.  Deneise Lever, [place], [place] Speech-Language Pathologist [place] 06/05/2018, 11:30 AM

## 2018-06-05 NOTE — Progress Notes (Signed)
Patient transported to hospice per [facility]AR. Per Hospice RN, d/c from unit with foley and IVs.

## 2018-06-05 NOTE — Progress Notes (Addendum)
Attempted report to [facility], left voicemail. Will try again.  Called report to [facility].

## 2018-06-05 NOTE — Discharge Summary (Addendum)
Stroke Discharge Summary  Patient ID: Logan Conner   MRN: [id number]      DOB: 1925/09/19  Date of Admission: 06/02/2018 Date of Discharge: 06/05/2018  Attending Physician:  Garvin Fila, MD, Stroke MD Consultant(s):    Interventional Radiology- Dr. Estanislado Pandy  Patient's PCP:  Rusty Aus, MD  DISCHARGE DIAGNOSIS: Stroke:  left MCA and ACA Infarcts s/p IV tPA and IR w/ TICI3 reperfusion of L M1 occlusion, infarct embolic secondary to unknown source Patient made DO NOT RESUSCITATE and comfort care by the family and been transferred to hospice nursing home   Active Problems: Acute ischemic left MCA stroke [facility]) Middle cerebral artery embolism, left Hypertension Dysphagia    Past Medical History:  Diagnosis Date  . Arthritis   . Asthma   . BPH (benign prostatic hyperplasia) 05/03/2014  . Cataract cortical, senile   . CKD (chronic kidney disease), stage III ([place]) 05/03/2014  . Collagen vascular disease (HCC)    RA  . Generalized OA 05/03/2014  . Glaucoma   . Hyperlipidemia   . Hyperlipidemia, unspecified 05/03/2014  . Hypertension 05/03/2014  . Hyperthyroidism   . Kidney stone 05/03/2014   Past Surgical History:  Procedure Laterality Date  . CATARACT EXTRACTION Right   . COLONOSCOPY  02/21/2006  . ESOPHAGOGASTRODUODENOSCOPY  02/21/2006  . JOINT REPLACEMENT Right    total hip  . kidney stone    . LITHOTRIPSY    . LUMBAR LAMINECTOMY/DECOMPRESSION MICRODISCECTOMY Right 06/04/2017   Procedure: LUMBAR LAMINECTOMY/DECOMPRESSION MICRODISCECTOMY 2 LEVELS L4-S1;  Surgeon: Meade Maw, MD;  Location: [facility] ORS;  Service: Neurosurgery;  Laterality: Right;  . RADIOLOGY WITH ANESTHESIA N/A 06/02/2018   Procedure: RADIOLOGY WITH ANESTHESIA;  Surgeon: Luanne Bras, MD;  Location: [place];  Service: Radiology;  Laterality: N/A;  . TOTAL HIP ARTHROPLASTY Right     Allergies as of 06/05/2018   No Known Allergies     Medication List    STOP taking these  medications   acetaminophen 650 MG CR tablet Commonly known as:  TYLENOL Replaced by:  acetaminophen 325 MG tablet   BENICAR 40 MG tablet Generic drug:  olmesartan   brimonidine 0.1 % Soln Commonly known as:  ALPHAGAN P   budesonide-formoterol 160-4.5 MCG/ACT inhaler Commonly known as:  SYMBICORT   CEROVITE SENIOR Tabs   finasteride 5 MG tablet Commonly known as:  PROSCAR   hydroxychloroquine 200 MG tablet Commonly known as:  PLAQUENIL   LUMIGAN 0.01 % Soln Generic drug:  bimatoprost   metoprolol succinate 50 MG 24 hr tablet Commonly known as:  TOPROL-XL   montelukast 10 MG tablet Commonly known as:  SINGULAIR   simvastatin 40 MG tablet Commonly known as:  ZOCOR   SYNTHROID 50 MCG tablet Generic drug:  levothyroxine   tamsulosin 0.4 MG Caps capsule Commonly known as:  FLOMAX     TAKE these medications   acetaminophen 325 MG tablet Commonly known as:  TYLENOL Take 2 tablets (650 mg total) by mouth every 4 (four) hours as needed for mild pain (or temp > 37.5 C (99.5 F)). Replaces:  acetaminophen 650 MG CR tablet   acetaminophen 650 MG suppository Commonly known as:  TYLENOL Place 1 suppository (650 mg total) rectally every 4 (four) hours as needed for mild pain (or temp > 37.5 C (99.5 F)).   chlorhexidine 0.12 % solution Commonly known as:  PERIDEX 15 mLs by Mouth Rinse route 2 (two) times daily.   collagenase ointment Commonly known as:  SANTYL Apply topically daily.   haloperidol 0.5 MG tablet Commonly known as:  HALDOL Take 1 tablet (0.5 mg total) by mouth every 4 (four) hours as needed for agitation (or delirium).   haloperidol 2 MG/ML solution Commonly known as:  HALDOL Place 0.3 mLs (0.6 mg total) under the tongue every 4 (four) hours as needed for agitation (or delirium).   LORazepam 2 MG/ML concentrated solution Commonly known as:  ATIVAN Place 0.5 mLs (1 mg total) under the tongue every 4 (four) hours as needed for anxiety.   LORazepam 1  MG tablet Commonly known as:  ATIVAN Take 1 tablet (1 mg total) by mouth every 4 (four) hours as needed for anxiety.   morphine CONCENTRATE 10 MG/0.5ML Soln concentrated solution Place 0.25 mLs (5 mg total) under the tongue every 2 (two) hours as needed for moderate pain (or dyspnea).       LABORATORY STUDIES CBC    Component Value Date/Time   WBC 12.0 (H) 06/03/2018 0344   RBC 3.74 (L) 06/03/2018 0344   HGB 11.9 (L) 06/03/2018 0344   HCT 36.5 (L) 06/03/2018 0344   PLT 154 06/03/2018 0344   MCV 97.6 06/03/2018 0344   MCH 31.8 06/03/2018 0344   MCHC 32.6 06/03/2018 0344   RDW 13.2 06/03/2018 0344   LYMPHSABS 1.0 06/03/2018 0344   MONOABS 1.0 06/03/2018 0344   EOSABS 0.1 06/03/2018 0344   BASOSABS 0.0 06/03/2018 0344   CMP    Component Value Date/Time   NA 143 06/03/2018 0344   K 3.8 06/03/2018 0344   CL 114 (H) 06/03/2018 0344   CO2 22 06/03/2018 0344   GLUCOSE 129 (H) 06/03/2018 0344   BUN 21 06/03/2018 0344   CREATININE 0.94 06/03/2018 0344   CALCIUM 8.7 (L) 06/03/2018 0344   PROT 5.8 (L) 06/02/2018 1347   ALBUMIN 3.7 06/02/2018 1347   AST 16 06/02/2018 1347   ALT 13 06/02/2018 1347   ALKPHOS 63 06/02/2018 1347   BILITOT 0.6 06/02/2018 1347   GFRNONAA >60 06/03/2018 0344   GFRAA >60 06/03/2018 0344   COAGS Lab Results  Component Value Date   INR 1.08 06/02/2018   INR 1.07 06/02/2017   Lipid Panel    Component Value Date/Time   CHOL 124 06/03/2018 0344   TRIG 76 06/03/2018 0344   HDL 49 06/03/2018 0344   CHOLHDL 2.5 06/03/2018 0344   VLDL 15 06/03/2018 0344   LDLCALC 60 06/03/2018 0344   HgbA1C  Lab Results  Component Value Date   HGBA1C 6.0 (H) 06/03/2018   Urinalysis    Component Value Date/Time   APPEARANCEUR Clear 08/20/2017 1006   GLUCOSEU Negative 08/20/2017 1006   BILIRUBINUR Negative 08/20/2017 1006   PROTEINUR Negative 08/20/2017 1006   NITRITE Negative 08/20/2017 1006   LEUKOCYTESUR Negative 08/20/2017 1006    SIGNIFICANT  DIAGNOSTIC STUDIES Ct Angio Head/Neck W Or Wo Contrast 06/02/2018  IMPRESSION:  1. Positive for Left MCA mid M1 emergent large vessel occlusion. No significant collateral flow on these early arterial phase images. This result was communicated to Dr. [name] at at [place] hours by text page via the [facility] messaging system. 2. No left carotid stenosis and no other arterial stenosis in the neck. 3. There is a moderate to severe stenosis of the right PCA P2 segment. Otherwise negative posterior circulation. 4.  Aortic Atherosclerosis (ICD10-I70.0).   Mr Brain Wo Contrast 06/03/2018 IMPRESSION: 1. Acute left MCA and distal left ACA infarcts. 2. Mild chronic small vessel ischemic disease.  Ct Head Code Stroke Wo Contrast 06/02/2018 IMPRESSION:  1. Positive for hyperdense Left MCA M1 compatible with Emergent Large Vessel Occlusion. 2. No hemorrhage or changes of acute infarct are identified. ASPECTS 10. 3.   Cerebral angiogram 06/02/2018 S/p Lt common carotid arteriogram followed by complete revascularization of Lt MCA M 1 occlusion with x 1 pass with embotrap 72mm x 33 mm retriever device achieving a TICI 3 revascularization  ECHO 06/03/18 Study Conclusions - Left ventricle: The cavity size was normal. Wall thickness was   increased in a pattern of mild LVH. Systolic function was normal.   The estimated ejection fraction was in the range of 60% to 65%.   The study is not technically sufficient to allow evaluation of LV   diastolic function. - Left atrium: The atrium was moderately dilated. - Atrial septum: No defect or patent foramen ovale was identified. - Pulmonary arteries: PA peak pressure: 47 mm Hg (S). - Pericardium, extracardiac: Small to moderate focal posterior   lateral pericardial effusion with no evidence of tamponade    HISTORY OF PRESENT ILLNESS Logan Conner is a 84 y.o. male with past medical history of hyperlipidemia, hypertension, CKD stage III.  Most of the information was  obtained from EMS as the wife has not arrived at this time and the incident needed emergent attention.  Attempts were made to call the wife however we only had the phone number of her home.  Per EMS patient was fully active prior to this event.  Patient was apparently reading a magazine flipping through some pages at 1315 when suddenly wife noted that he became very unresponsive with a left gaze.  EMS arrived and noticed that this was a code stroke and drove patient from [place] to [facility] for acute intervention.   NIHSS was 28 on arrival. CT head was negative for hemorhage. CTA of head and neck was obtained showing a left MCA cutoff.  tPA was administered and patient was immediately taken to interventional radiology for acute intervention.  Unfortunately patient is mute at this time and no information other than above can be gathered. Per med rec patient was on aspirin and Plavix    HOSPITAL COURSE Mr. SHAKEEM STERN is a 84 y.o. male with history of HTN, HLD and CKD stage III presenting with L gaze, mute and unresponsive. Given tPA 06/02/2018 at 1403. Found to have a L M1 occlusion and taken to IR. Post IR TICI3 revascularization.  Despite emergent neuro intervention, patient continues to have disabling left ACA and MCA infarcts.  Despite neuro intervention, patient continues to have dysphagia, aphasia, unable to swallow and family does not want feeding tube placement for nutrition. On 05/2518 family decided for full comfort care, make patient DNR and ceased all aggressive care measures.  Patient will be discharged to inpatient hospice in [place].  Stroke:  left MCA and ACA infarcts s/p IV tPA and IR w/ TICI3 reperfusion of L M1 occlusion, infarct embolic secondary to unknown source  R groin sheath remains in - to be removed  Code Stroke CT head Hyperdense L MCA M1 w/ ELVO, no hmg or acute stroke seen. ASPECTS 10.     CTA head & neck L MCA mid M1 ELVO. Mod stenosis R PCA P2.  Aortic atherosclerosis.  Cerebral angio TICI3 reperfusion of L M1 occlusion with 1 pass embotrap  MRI  L MCA and L ACA territory infarcts. Small vessel disease.    2D Echo  EF 60-65%. No source  of embolus   LDL 60  HgbA1c 6.0  SCDs for VTE prophylaxis  Diet NPO - failed SLP assessment for swallow 7/24.   No antithrombotic prior to admission, now on No antithrombotic as within 24h of tPA  Therapy recommendations:  SNF, but family decided on com,fort measures with inpatient hospice admission  Dysphagia  Secondary to stroke  NPO-family refused feeding tube and opted for comfort measures and cessation of all aggressive interventions.  Hypertension  Initially on admission patient was on Cleviprex drip  BP currently stable in the 130s  Hyperlipidemia  Home meds:  zocor 40 mg daily-plan was to resume for statin once able to swallow  LDL 60, goal < 70   Other Stroke Risk Factors  Advanced age  UDS / ETOH level not performed   Other Active Problems  CKD stage III  DISCHARGE EXAM Blood pressure 138/79, pulse (!) 122, temperature 98.1 F (36.7 C), temperature source Oral, resp. rate 18, height 6' (1.829 m), weight 68.8 kg (151 lb 10.8 oz), SpO2 98 %. Frail elderly Caucasian male not in distress. . Afebrile. Head is nontraumatic. Neck is supple without bruit.    Cardiac exam no murmur or gallop. Lungs are clear to auscultation. Distal pulses are well felt.has right groin arterial sheath  Neurological Exam :  Awake alert globally aphasic and mute. Has left gaze and head deviation. Will not follow gaze to the right. Blinks to threat on the left but not the right. Pupils irregular 3 mm reactive. Corneal reflexes present. Fundi not visualized. Right lower facial weakness. Tongue midline. Flaccid right hemiplegia but will withdraw right lower extremity well and right upper extremity trace to painful stimuli.  Minimally follows commands but does follow some commands to  mimicking and pantomime.  Purposeful antigravity movements on the left side. Tone is diminished on the right and normal on the left. Reflexes diminished on the right and normal on the left. Right plantar upgoing left downgoing.  Discharge Diet    NPO  DISCHARGE PLAN  Disposition:  [facility] in [place]  No anticoagulation for secondary stroke prevention at discharge as patient is comfort measures with cessation of all aggressive measures.Cotninue comfort care medications  Follow-up with inpatient hospice facility for further care and management  45 minutes were spent preparing discharge.   Lowella Petties Nyanor 06/05/18 11:09 AM I have personally examined this patient, reviewed notes, independently viewed imaging studies, participated in medical decision making and plan of care.ROS completed by me personally and pertinent positives fully documented  I have made any additions or clarifications directly to the above note. Agree with note above.    Antony Contras, MD Medical Director [facility] Pager: [phone] 06/05/2018 11:57 AM

## 2018-06-05 NOTE — Progress Notes (Signed)
Discharge to: [facility] Anticipated discharge date: 06/05/18 Family notified: Yes, wife, by phone Transportation by: [facility]  Report #: [phone]  Transport set for 1:00 PM.  CSW signing off.  Laveda Abbe LCSW [phone]

## 2018-06-05 NOTE — Care Management Note (Signed)
Case Management Note  Patient Details  Name: AUSTEN OYSTER MRN: [id number] Date of Birth: Oct 23, 1925  Subjective/Objective:                    Action/Plan: Pt discharged to [place]. CM signing off.    Expected Discharge Date:  06/05/18               Expected Discharge Plan:  [place]  In-House Referral:  Clinical Social Work  Discharge planning Services     Post Acute Care Choice:    Choice offered to:     DME Arranged:    DME Agency:     HH Arranged:    [place] Agency:     Status of Service:  Completed, signed off  If discussed at [employer], dates discussed:    Additional Comments:  Pollie Friar, RN 06/05/2018, 12:47 PM

## 2018-06-08 ENCOUNTER — Encounter (HOSPITAL_COMMUNITY): Payer: Self-pay | Admitting: Interventional Radiology

## 2018-06-08 NOTE — Progress Notes (Signed)
Late entry for missed Modified Rankin Score.  Based on review of medical record including PT evaluation by Donnella Sham.    06/03/18 1630  Modified Rankin (Stroke Patients Only)  Pre-Morbid Rankin Score 0  Modified Rankin [place], [place]  [phone] 06/08/2018

## 2018-06-11 DEATH — deceased

## 2018-07-26 IMAGING — MR MR LUMBAR SPINE W/O CM
4 of 5 series · 14 of 48 positions shown · non-contrast
Comparison: None.

CLINICAL DATA: Low back pain. Bilateral leg pain, right greater
left.

EXAM:
MRI LUMBAR SPINE WITHOUT CONTRAST
TECHNIQUE: Multiplanar, multisequence MR imaging of the lumbar spine was
performed. No intravenous contrast was administered.

[Series 2: T2 · sagittal · 4.0mm · 0.44mm/px · 5 of 18 slices shown (1 of 2)]
[im 1/18]
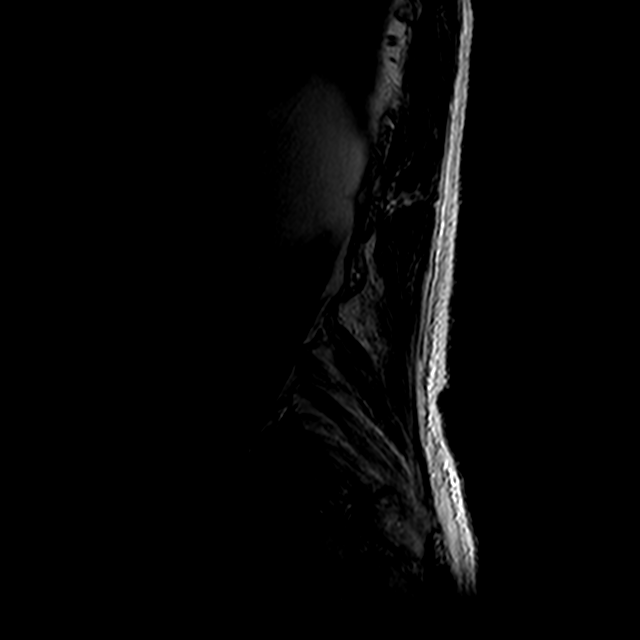
[im 4/18]
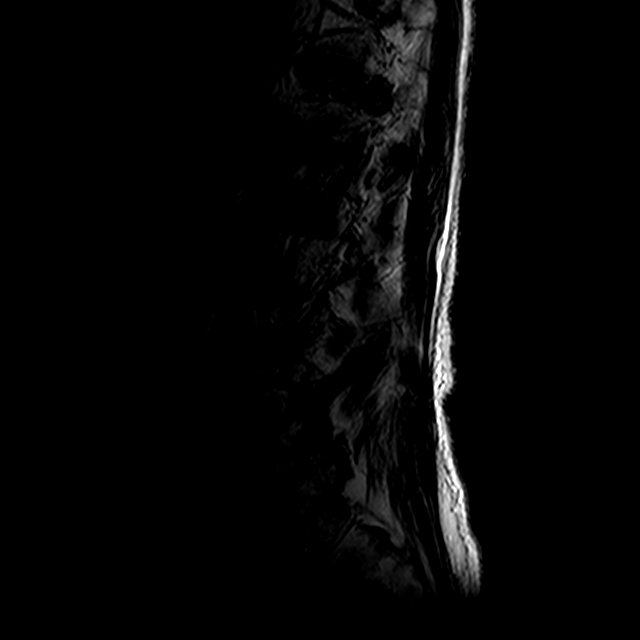
[im 7/18]
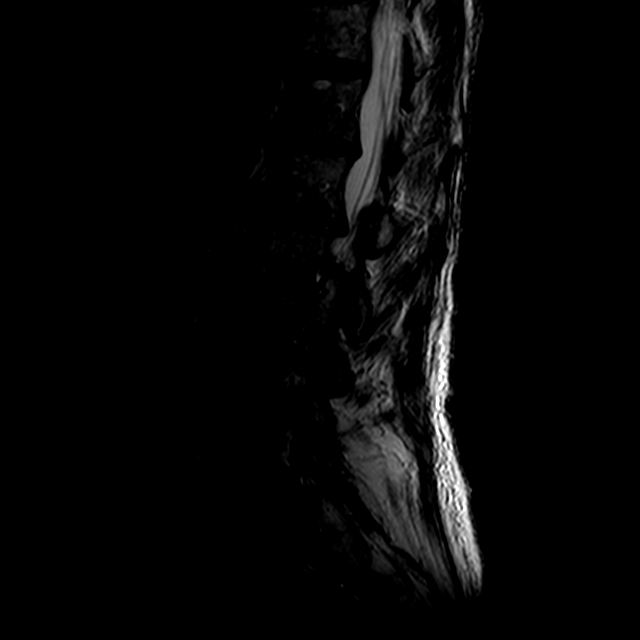
[im 11/18]
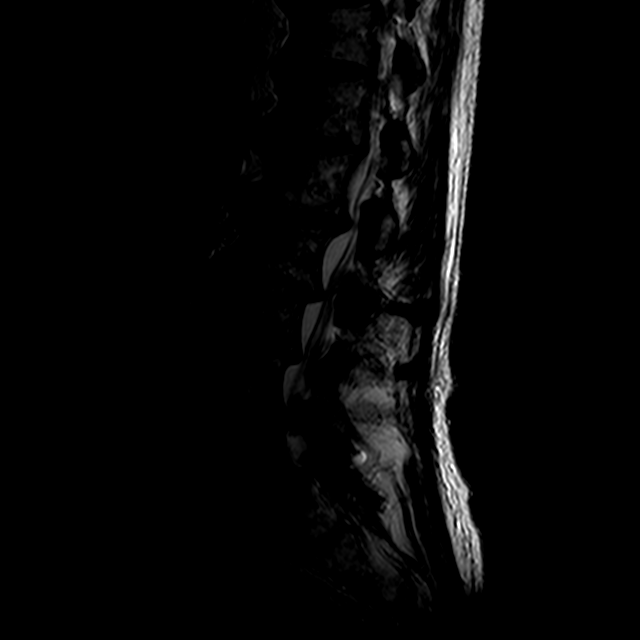
[im 18/18]
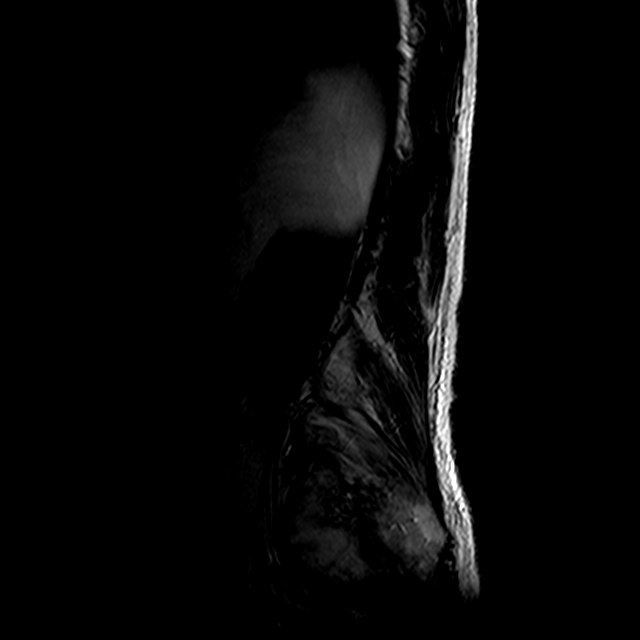

[Series 3: T1 · sagittal · 4.0mm · 0.44mm/px · 3 of 18 slices shown (1 of 2)]
[im 3/18]
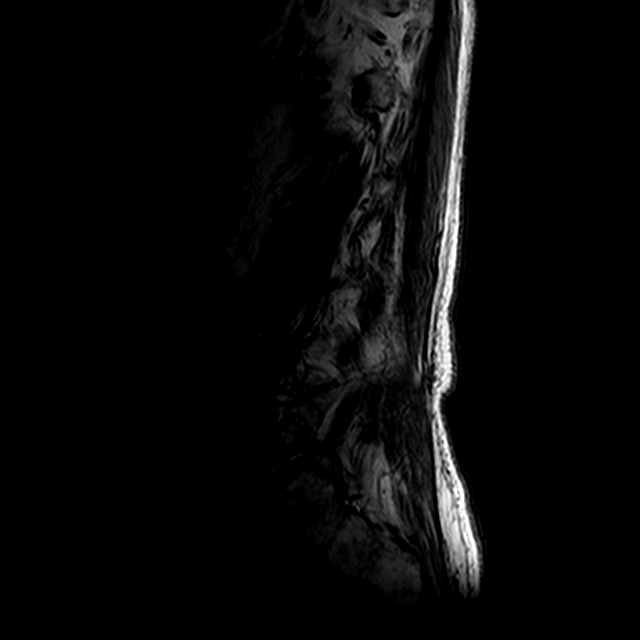
[im 9/18]
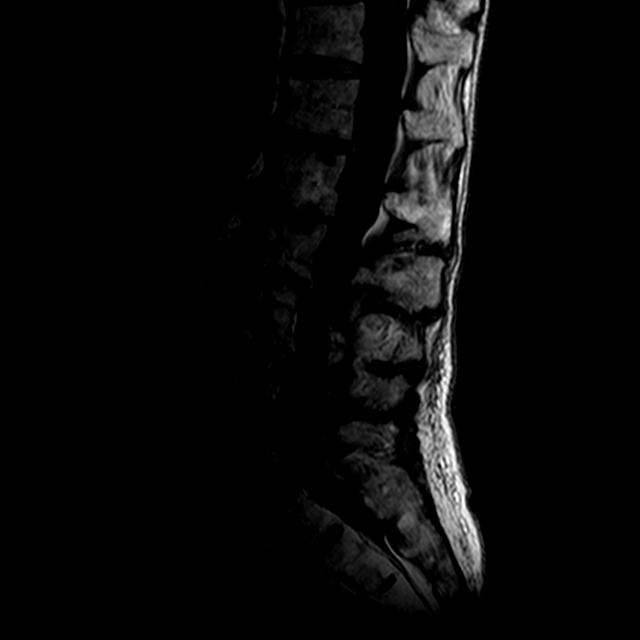
[im 15/18]
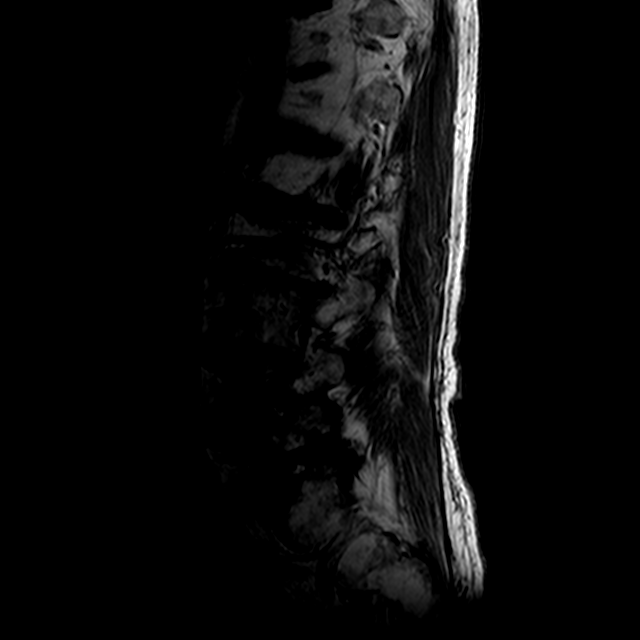

[Series 5: T2 · axial · 4.0mm · 0.39mm/px · z∈[-78,+79]mm · 3 of 39 slices shown (2 of 2)]
[im 6/39]
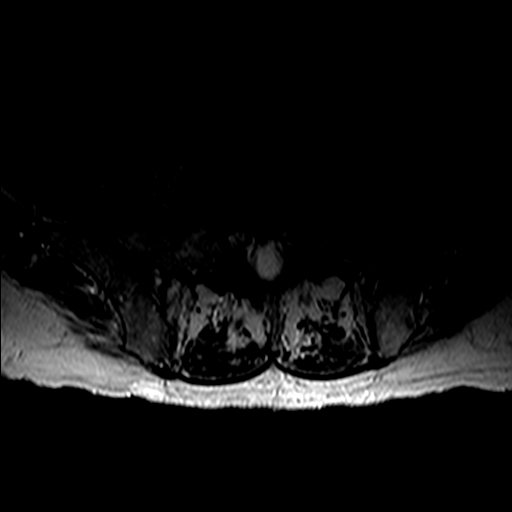
[im 21/39]
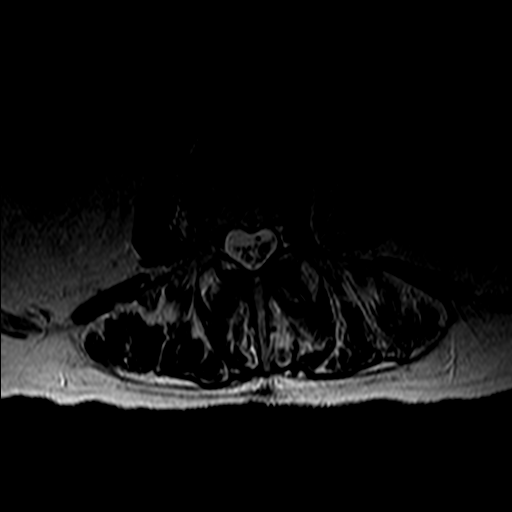
[im 33/39]
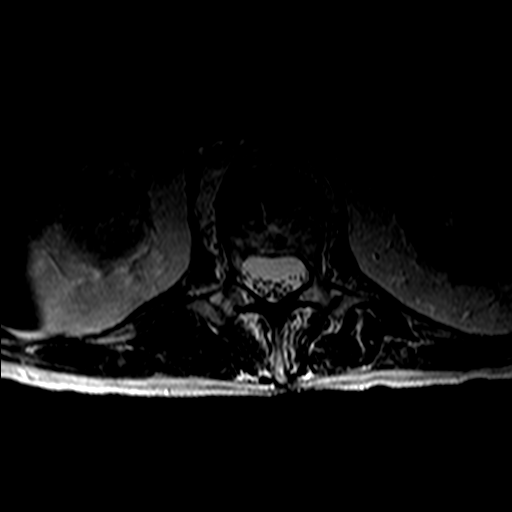

[Series 6: T1 · axial · 4.0mm · 0.39mm/px · z∈[-78,+79]mm · 3 of 39 slices shown (2 of 2)]
[im 6/39]
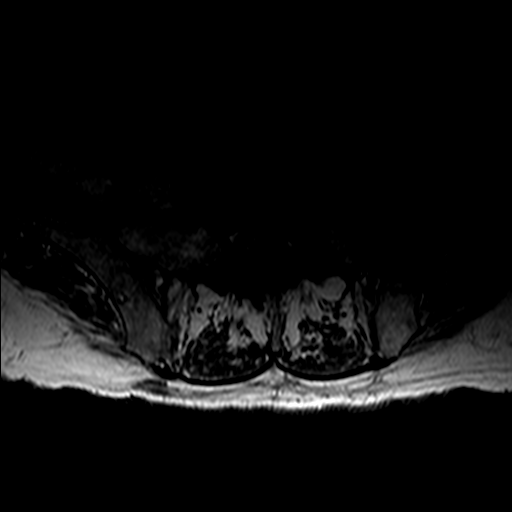
[im 21/39]
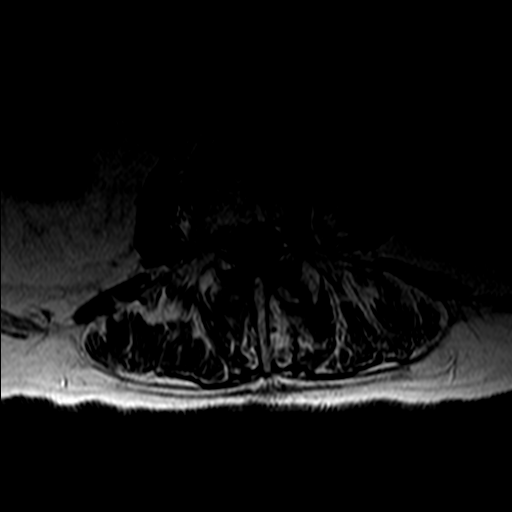
[im 33/39]
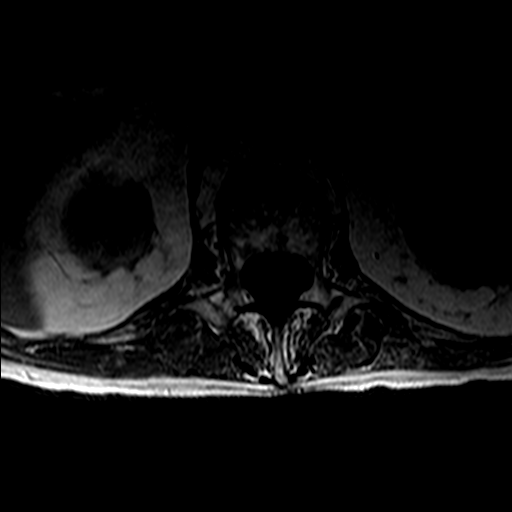

[14 of 48 positions shown; findings below may reference images not displayed]

FINDINGS: Segmentation: 5 non rib-bearing lumbar type vertebral bodies are
present.

Alignment: Slight retrolisthesis is present at L1-2, L2-3, and L3-4.

Vertebrae: Chronic endplate marrow changes are present throughout
the lumbar spine. There is fatty infiltration of the marrow space is
well, particularly in iliac bones.

Conus medullaris: Extends to the T12-L1 level and appears normal.

Paraspinal and other soft tissues: Limited imaging of the abdomen
demonstrates a focal mass lesion in the medial aspect of the left
kidney measuring 2.4 x 2.9 cm. A 2.4 cm cyst is present at the lower
pole of the right kidney. No other focal lesions are present. There
is no significant adenopathy.

Disc levels:

T12-L1: Mild disc bulging facet hypertrophy is present without
significant stenosis.

L1-2: Lateral disc protrusions are noted. Facet hypertrophy is noted
bilaterally. Mild subarticular narrowing is evident bilaterally.
Right greater than left mild foraminal narrowing is present. The
central canal is patent.

L2-3: A broad-based disc protrusion is present. Moderate facet
hypertrophy is noted bilaterally. Moderate left mild right
subarticular narrowing is present. Moderate foraminal stenosis is
worse on the left.

L3-4: A broad-based disc protrusion is present. Moderate facet
hypertrophy is noted bilaterally. This results in moderate
subarticular and foraminal narrowing bilaterally.

L4-5: A broad-based disc protrusion is present. Moderate facet
hypertrophy and ligamentum flavum thickening results in severe
central canal stenosis. Moderate foraminal narrowing is evident
bilaterally.

L5-S1: A broad-based disc protrusion is present. Moderate facet
hypertrophy is noted bilaterally. Spurring results in moderate
subarticular narrowing bilaterally. Mild foraminal narrowing is
present bilaterally.
IMPRESSION: 1. The most significant disease is at L4-5 with severe central and
moderate bilateral foraminal narrowing.
2. Moderate subarticular and mild foraminal narrowing bilaterally at
L5-S1.
3. Moderate subarticular and foraminal stenosis bilaterally at L3-4.
4. Moderate left and mild right subarticular and moderate bilateral
foraminal narrowing at L2-3.
5. Right greater than left mild foraminal narrowing at L1-2

## 2018-09-03 ENCOUNTER — Encounter: Payer: Self-pay | Admitting: Urology

## 2018-09-03 ENCOUNTER — Other Ambulatory Visit: Payer: Medicare Other

## 2018-09-08 ENCOUNTER — Encounter: Payer: Self-pay | Admitting: Urology

## 2018-09-08 ENCOUNTER — Ambulatory Visit: Payer: Medicare Other | Admitting: Urology

## 2018-09-25 ENCOUNTER — Other Ambulatory Visit: Payer: Self-pay
